# Patient Record
Sex: Male | Born: 1960 | Race: White | Hispanic: No | Smoking: Never smoker
Health system: Southern US, Community
[De-identification: ages and names within clinical notes are randomized; demographics above are authoritative.]

## PROBLEM LIST (undated history)

## (undated) DIAGNOSIS — G4733 Obstructive sleep apnea (adult) (pediatric): Secondary | ICD-10-CM

## (undated) DIAGNOSIS — N200 Calculus of kidney: Secondary | ICD-10-CM

## (undated) DIAGNOSIS — E7800 Pure hypercholesterolemia, unspecified: Secondary | ICD-10-CM

## (undated) DIAGNOSIS — Z9989 Dependence on other enabling machines and devices: Secondary | ICD-10-CM

## (undated) DIAGNOSIS — I10 Essential (primary) hypertension: Secondary | ICD-10-CM

## (undated) HISTORY — PX: TONSILLECTOMY AND ADENOIDECTOMY: SUR1326

## (undated) HISTORY — PX: HERNIA REPAIR: SHX51

---

## 1998-06-20 ENCOUNTER — Emergency Department (HOSPITAL_COMMUNITY): Admission: EM | Admit: 1998-06-20 | Discharge: 1998-06-20 | Payer: Self-pay

## 1999-04-04 ENCOUNTER — Encounter: Payer: Self-pay | Admitting: Occupational Medicine

## 1999-04-04 ENCOUNTER — Ambulatory Visit: Admission: RE | Admit: 1999-04-04 | Discharge: 1999-04-04 | Payer: Self-pay | Admitting: Occupational Medicine

## 2002-01-27 ENCOUNTER — Emergency Department (HOSPITAL_COMMUNITY): Admission: EM | Admit: 2002-01-27 | Discharge: 2002-01-27 | Payer: Self-pay | Admitting: Emergency Medicine

## 2005-05-01 ENCOUNTER — Ambulatory Visit (HOSPITAL_COMMUNITY): Admission: RE | Admit: 2005-05-01 | Discharge: 2005-05-01 | Payer: Self-pay | Admitting: Internal Medicine

## 2008-03-01 ENCOUNTER — Emergency Department (HOSPITAL_COMMUNITY): Admission: EM | Admit: 2008-03-01 | Discharge: 2008-03-01 | Payer: Self-pay | Admitting: Emergency Medicine

## 2008-06-17 ENCOUNTER — Emergency Department (HOSPITAL_COMMUNITY): Admission: EM | Admit: 2008-06-17 | Discharge: 2008-06-17 | Payer: Self-pay | Admitting: Emergency Medicine

## 2008-06-20 ENCOUNTER — Encounter (INDEPENDENT_AMBULATORY_CARE_PROVIDER_SITE_OTHER): Payer: Self-pay | Admitting: Internal Medicine

## 2008-06-20 ENCOUNTER — Ambulatory Visit: Payer: Self-pay | Admitting: Vascular Surgery

## 2008-06-20 ENCOUNTER — Inpatient Hospital Stay (HOSPITAL_COMMUNITY): Admission: EM | Admit: 2008-06-20 | Discharge: 2008-06-25 | Payer: Self-pay | Admitting: Emergency Medicine

## 2009-06-11 ENCOUNTER — Observation Stay (HOSPITAL_COMMUNITY): Admission: EM | Admit: 2009-06-11 | Discharge: 2009-06-12 | Payer: Self-pay | Admitting: Emergency Medicine

## 2009-09-08 ENCOUNTER — Emergency Department (HOSPITAL_COMMUNITY): Admission: EM | Admit: 2009-09-08 | Discharge: 2009-09-08 | Payer: Self-pay | Admitting: Family Medicine

## 2009-09-21 ENCOUNTER — Emergency Department (HOSPITAL_COMMUNITY): Admission: EM | Admit: 2009-09-21 | Discharge: 2009-09-21 | Payer: Self-pay | Admitting: Emergency Medicine

## 2009-09-27 ENCOUNTER — Emergency Department (HOSPITAL_COMMUNITY): Admission: EM | Admit: 2009-09-27 | Discharge: 2009-09-27 | Payer: Self-pay | Admitting: Emergency Medicine

## 2009-11-03 ENCOUNTER — Emergency Department (HOSPITAL_COMMUNITY): Admission: EM | Admit: 2009-11-03 | Discharge: 2009-11-03 | Payer: Self-pay | Admitting: Family Medicine

## 2009-11-28 ENCOUNTER — Emergency Department (HOSPITAL_COMMUNITY): Admission: EM | Admit: 2009-11-28 | Discharge: 2009-11-28 | Payer: Self-pay | Admitting: Family Medicine

## 2010-01-06 ENCOUNTER — Emergency Department (HOSPITAL_COMMUNITY): Admission: EM | Admit: 2010-01-06 | Discharge: 2010-01-06 | Payer: Self-pay | Admitting: Family Medicine

## 2010-01-11 HISTORY — PX: CARDIAC CATHETERIZATION: SHX172

## 2010-01-11 HISTORY — PX: CORONARY ANGIOGRAM: SHX5786

## 2010-02-03 ENCOUNTER — Ambulatory Visit: Payer: Self-pay | Admitting: Internal Medicine

## 2010-02-03 ENCOUNTER — Observation Stay (HOSPITAL_COMMUNITY): Admission: EM | Admit: 2010-02-03 | Discharge: 2010-02-07 | Payer: Self-pay | Admitting: Emergency Medicine

## 2010-02-04 ENCOUNTER — Encounter (INDEPENDENT_AMBULATORY_CARE_PROVIDER_SITE_OTHER): Payer: Self-pay | Admitting: Internal Medicine

## 2010-07-10 ENCOUNTER — Emergency Department (HOSPITAL_COMMUNITY): Admission: EM | Admit: 2010-07-10 | Discharge: 2010-07-10 | Payer: Self-pay | Admitting: Family Medicine

## 2010-07-24 ENCOUNTER — Emergency Department (HOSPITAL_COMMUNITY)
Admission: EM | Admit: 2010-07-24 | Discharge: 2010-07-24 | Payer: Self-pay | Source: Home / Self Care | Admitting: Emergency Medicine

## 2010-10-20 ENCOUNTER — Inpatient Hospital Stay (INDEPENDENT_AMBULATORY_CARE_PROVIDER_SITE_OTHER)
Admission: RE | Admit: 2010-10-20 | Discharge: 2010-10-20 | Disposition: A | Discharge status: Home / Self Care | Payer: BC Managed Care – PPO | Source: Ambulatory Visit | Attending: Family Medicine | Admitting: Family Medicine

## 2010-10-20 DIAGNOSIS — R197 Diarrhea, unspecified: Secondary | ICD-10-CM

## 2010-10-20 DIAGNOSIS — R112 Nausea with vomiting, unspecified: Secondary | ICD-10-CM

## 2010-10-29 LAB — DIFFERENTIAL
Eosinophils Relative: 2 % (ref 0–5)
Lymphocytes Relative: 12 % (ref 12–46)
Lymphs Abs: 1.1 10*3/uL (ref 0.7–4.0)
Monocytes Relative: 6 % (ref 3–12)
Neutrophils Relative %: 80 % — ABNORMAL HIGH (ref 43–77)

## 2010-10-29 LAB — TROPONIN I: Troponin I: <0.01 ng/mL (ref 0.00–0.06)

## 2010-10-29 LAB — LIPID PANEL
Total CHOL/HDL Ratio: 4.4 RATIO
Triglycerides: 154 mg/dL — ABNORMAL HIGH (ref <150)
VLDL: 31 mg/dL (ref 0–40)

## 2010-10-29 LAB — BASIC METABOLIC PANEL
BUN: 12 mg/dL (ref 6–23)
BUN: 15 mg/dL (ref 6–23)
BUN: 6 mg/dL (ref 6–23)
CO2: 25 mEq/L (ref 19–32)
CO2: 25 mEq/L (ref 19–32)
Calcium: 8.1 mg/dL — ABNORMAL LOW (ref 8.4–10.5)
Calcium: 8.8 mg/dL (ref 8.4–10.5)
Creatinine, Ser: 0.75 mg/dL (ref 0.4–1.5)
Creatinine, Ser: 0.8 mg/dL (ref 0.4–1.5)
Creatinine, Ser: 0.97 mg/dL (ref 0.4–1.5)
GFR calc Af Amer: >60 mL/min (ref >60)
GFR calc Af Amer: >60 mL/min (ref >60)
GFR calc Af Amer: >60 mL/min (ref >60)
GFR calc non Af Amer: >60 mL/min (ref >60)
GFR calc non Af Amer: >60 mL/min (ref >60)
GFR calc non Af Amer: >60 mL/min (ref >60)
Glucose, Bld: 100 mg/dL — ABNORMAL HIGH (ref 70–99)
Glucose, Bld: 102 mg/dL — ABNORMAL HIGH (ref 70–99)
Glucose, Bld: 107 mg/dL — ABNORMAL HIGH (ref 70–99)
Potassium: 3.6 mEq/L (ref 3.5–5.1)
Potassium: 3.6 mEq/L (ref 3.5–5.1)
Sodium: 135 mEq/L (ref 135–145)
Sodium: 136 mEq/L (ref 135–145)
Sodium: 137 mEq/L (ref 135–145)
Sodium: 139 mEq/L (ref 135–145)

## 2010-10-29 LAB — CBC
HCT: 37.1 % — ABNORMAL LOW (ref 39.0–52.0)
HCT: 37.9 % — ABNORMAL LOW (ref 39.0–52.0)
HCT: 38.5 % — ABNORMAL LOW (ref 39.0–52.0)
Hemoglobin: 12.7 g/dL — ABNORMAL LOW (ref 13.0–17.0)
Hemoglobin: 12.9 g/dL — ABNORMAL LOW (ref 13.0–17.0)
Hemoglobin: 13.4 g/dL (ref 13.0–17.0)
MCHC: 34.1 g/dL (ref 30.0–36.0)
MCHC: 34.7 g/dL (ref 30.0–36.0)
MCV: 85.2 fL (ref 78.0–100.0)
Platelets: 144 10*3/uL — ABNORMAL LOW (ref 150–400)
Platelets: 245 10*3/uL (ref 150–400)
RBC: 4.43 MIL/uL (ref 4.22–5.81)
RBC: 4.43 MIL/uL (ref 4.22–5.81)
RBC: 4.53 MIL/uL (ref 4.22–5.81)
RDW: 15.9 % — ABNORMAL HIGH (ref 11.5–15.5)
RDW: 16.3 % — ABNORMAL HIGH (ref 11.5–15.5)
RDW: 16.3 % — ABNORMAL HIGH (ref 11.5–15.5)
WBC: 6.5 10*3/uL (ref 4.0–10.5)

## 2010-10-29 LAB — CULTURE, BLOOD (ROUTINE X 2)
Culture: NO GROWTH
Culture: NO GROWTH

## 2010-10-29 LAB — CARDIAC PANEL(CRET KIN+CKTOT+MB+TROPI)
CK, MB: 5.4 ng/mL — ABNORMAL HIGH (ref 0.3–4.0)
Relative Index: 0.8 (ref 0.0–2.5)
Relative Index: 1 (ref 0.0–2.5)
Total CK: 534 U/L — ABNORMAL HIGH (ref 7–232)
Troponin I: 0.01 ng/mL (ref 0.00–0.06)
Troponin I: 0.02 ng/mL (ref 0.00–0.06)

## 2010-10-29 LAB — CK TOTAL AND CKMB (NOT AT ARMC)
Relative Index: 1 (ref 0.0–2.5)
Total CK: 680 U/L — ABNORMAL HIGH (ref 7–232)

## 2010-11-16 LAB — URINALYSIS, ROUTINE W REFLEX MICROSCOPIC
Bilirubin Urine: NEGATIVE
Hgb urine dipstick: NEGATIVE
Nitrite: NEGATIVE
Nitrite: NEGATIVE
Protein, ur: NEGATIVE mg/dL
Specific Gravity, Urine: 1.013 (ref 1.005–1.030)
Specific Gravity, Urine: 1.017 (ref 1.005–1.030)
Urobilinogen, UA: 0.2 mg/dL (ref 0.0–1.0)
Urobilinogen, UA: 0.2 mg/dL (ref 0.0–1.0)
pH: 7 (ref 5.0–8.0)

## 2010-11-16 LAB — COMPREHENSIVE METABOLIC PANEL
ALT: 33 U/L (ref 0–53)
AST: 23 U/L (ref 0–37)
AST: 25 U/L (ref 0–37)
Albumin: 3.2 g/dL — ABNORMAL LOW (ref 3.5–5.2)
CO2: 26 mEq/L (ref 19–32)
Calcium: 8.1 mg/dL — ABNORMAL LOW (ref 8.4–10.5)
Creatinine, Ser: 0.89 mg/dL (ref 0.4–1.5)
GFR calc Af Amer: >60 mL/min (ref >60)
GFR calc Af Amer: >60 mL/min (ref >60)
GFR calc non Af Amer: >60 mL/min (ref >60)
GFR calc non Af Amer: >60 mL/min (ref >60)
Glucose, Bld: 103 mg/dL — ABNORMAL HIGH (ref 70–99)
Sodium: 134 mEq/L — ABNORMAL LOW (ref 135–145)
Total Protein: 6.4 g/dL (ref 6.0–8.3)
Total Protein: 6.7 g/dL (ref 6.0–8.3)

## 2010-11-16 LAB — T4, FREE: Free T4: 0.79 ng/dL — ABNORMAL LOW (ref 0.80–1.80)

## 2010-11-16 LAB — CBC
Hemoglobin: 13.5 g/dL (ref 13.0–17.0)
MCHC: 33.3 g/dL (ref 30.0–36.0)
MCHC: 33.5 g/dL (ref 30.0–36.0)
MCV: 86.1 fL (ref 78.0–100.0)
RBC: 4.69 MIL/uL (ref 4.22–5.81)
RBC: 4.82 MIL/uL (ref 4.22–5.81)
RDW: 16.8 % — ABNORMAL HIGH (ref 11.5–15.5)

## 2010-11-16 LAB — CULTURE, BLOOD (ROUTINE X 2)
Culture: NO GROWTH
Culture: NO GROWTH

## 2010-11-16 LAB — WOUND CULTURE: Gram Stain: NONE SEEN

## 2010-11-16 LAB — LIPID PANEL
Cholesterol: 141 mg/dL (ref 0–200)
LDL Cholesterol: 87 mg/dL (ref 0–99)
Triglycerides: 140 mg/dL (ref <150)

## 2010-11-16 LAB — DIFFERENTIAL
Basophils Absolute: 0 10*3/uL (ref 0.0–0.1)
Eosinophils Absolute: 0.5 10*3/uL (ref 0.0–0.7)
Lymphocytes Relative: 20 % (ref 12–46)
Monocytes Relative: 4 % (ref 3–12)
Neutrophils Relative %: 70 % (ref 43–77)

## 2010-11-16 LAB — HEMOGLOBIN A1C
Hgb A1c MFr Bld: 5.9 % (ref 4.6–6.1)
Mean Plasma Glucose: 123 mg/dL

## 2010-11-16 LAB — LACTIC ACID, PLASMA: Lactic Acid, Venous: 1.5 mmol/L (ref 0.5–2.2)

## 2010-12-26 NOTE — Discharge Summary (Signed)
NAME:  Anthony Fitzpatrick, Anthony Fitzpatrick                ACCOUNT NO.:  [id number]   MEDICAL RECORD NO.:  [id number]          PATIENT TYPE:  INP   LOCATION:  1521                         FACILITY:  [facility]   PHYSICIAN:  Hind I Elsaid, MD      DATE OF BIRTH:  Jul 22, 1961   DATE OF ADMISSION:  06/19/2008  DATE OF DISCHARGE:  06/25/2008                               DISCHARGE SUMMARY   PRIMARY CARE PHYSICIAN:  Cala Bradford R. Renae Gloss, M.D.   DISCHARGE DIAGNOSES:  1. Left lower leg cellulitis.  2. Herpes zoster shingle infection of the left thigh.  3. Chronic venous insufficiency with venous stasis of both lower      extremities.  4. Morbid obesity.  5. Hypertension.   DISCHARGE MEDICATIONS:  1. Clindamycin 300 mg p.o. q.6h. for two weeks.  2. Valtrex 1,000 mg p.o. t.i.d. for two days.  3. Cardizem CD 240 mg daily.  4. Crestor 10 mg daily. .  5. Lisinopril 20 mg daily.  6. Lasix 40 mg daily.  7. Potassium chloride 10 mEq daily.   CONSULTATION:  None.   PROCEDURES:  1. CT angiogram negative for pulmonary embolism.  2. Venous Doppler of the lower extremity:  No evidence of deep venous      thrombosis, superficial thrombosis or Baker's cyst.   HISTORY AND HOSPITAL COURSE:  This 50 year old male admitted with  redness and swelling of his lower aspect of his left leg along with  painful burning rash along the side of his left thigh.  The patient was  diagnosed left leg cellulitis, started on broad-spectrum antibiotic.  Also, there is evidence of some bullae on his left thigh diagnosed as  herpes zoster and shingles and for that the patient was started on  Valtrex therapy for 7 days until crustation happening.  The patient was  evaluated and CT angio and venous Doppler were negative. During hospital  stay the patient has no fever, no white blood cells and the redness and  swelling gradually coming down.  The patient will be discharged with  p.o. antibiotic, mainly Clindamycin and Valtrex.  The patient also  asked  to use TED hose for his left  lower extremity swelling.  At this time we felt the patient was  medically stable to be discharged home and follow with Dr. Andi Devon within one week.  Also the patient has a history of sleep apnea  and needs to continue with his CPAP.      Hind Bosie Helper, MD  Electronically Signed     HIE/MEDQ  D:  06/25/2008  T:  06/25/2008  Job:  [id number]   cc:   Merlene Laughter. Renae Gloss, M.D.  Fax: [phone]

## 2010-12-26 NOTE — H&P (Signed)
NAME:  Anthony Fitzpatrick, Anthony Fitzpatrick                ACCOUNT NO.:  [id number]   MEDICAL RECORD NO.:  [id number]          PATIENT TYPE:  INP   LOCATION:  1521                         FACILITY:  [facility]   PHYSICIAN:  Della Goo, M.D. DATE OF BIRTH:  1961/07/09   DATE OF ADMISSION:  06/19/2008  DATE OF DISCHARGE:                              HISTORY & PHYSICAL   PRIMARY CARE PHYSICIAN:  Dr. Andi Devon.   CHIEF COMPLAINTS:  Redness, swelling, left lower leg.   HISTORY OF PRESENT ILLNESS:  This is a 50 year old male who presents to  the emergency department with complaints of worsening redness and  swelling in the lower aspect of the left leg along with a painful  burning rash which has occurred on the side of the left thigh.  Patient  reports beginning to have the redness in the lower leg 1 day ago which  has worsened and has been very painful to palpation.  He reports the  redness has worsened.  He reports that his legs usually are dark in both  of the lower legs and the redness has been a significant change.  Patient denies having any fevers, chills, chest pain, shortness of  breath, nausea, vomiting, or diarrhea.   PAST MEDICAL HISTORY:  Significant for:  1. Hypertension.  2. Hyperlipidemia.  3. Morbid obesity.  4. Depression.   MEDICATIONS:  Include:  1. Cardizem CD 240 mg 1 p.o. daily.  2. Crestor 10 mg 1 p.o. daily.  3. Lexapro 10 mg 1 p.o. daily.  4. Lisinopril 20 mg 1 p.o. daily.  5. Lasix 40 mg 1 p.o. daily.  6. Potassium chloride 10 mEq p.o. daily.   ALLERGIES:  NO KNOWN DRUG ALLERGIES.   SOCIAL HISTORY:  The patient is married.  He is a nonsmoker, nondrinker.   FAMILY HISTORY:  Noncontributory.   REVIEW OF SYSTEMS:  Pertinents are mentioned above.   PHYSICAL EXAMINATION FINDINGS:  This is a 50 year old morbidly obese  male in discomfort but no acute distress.  VITAL SIGNS:  Temperature 97.2.  Blood pressure 130/74.  Heart rate 83.  Respirations 20.  O2 sats 94% to  100%.  HEENT EXAMINATION:  Normocephalic, atraumatic.  Pupils equally round,  reactive to light.  Extraocular movements are intact.  Funduscopic  benign.  Oropharynx is clear.  NECK:  Supple.  Full range of motion.  No thyromegaly, adenopathy, or  jugular venous distention.  CARDIOVASCULAR:  Regular rate and rhythm.  No murmurs, gallops, or rubs.  LUNGS:  Clear to auscultation bilaterally.  ABDOMEN:  Positive bowel sounds.  Soft, nontender, nondistended.  EXTREMITIES:  Reveal trace edema of both lower legs to the pretibial  areas.  Also, patient has confluent erythema of the left lower leg which  is circumferential around the lower leg to the pretibial area.  There  are no visible ulcerations or punctate seen.  Also, there is a linear  vesicular erythematous rash on the lateral aspect of the left thigh.  NEUROLOGIC EXAMINATION:  Nonfocal.   LABORATORY STUDIES:  White blood cell count 7.5, hemoglobin 12.9,  hematocrit 38.1, platelets 204, neutrophils 77%  lymphocytes 14%.  Sodium  135, potassium 3.9, chloride 99, bicarb 26, BUN 10, creatinine 1.2, and  glucose 113.  D-dimer 1.22.  Point of care cardiac markers with a  myoglobin of 192, CK-MB 5.0, troponin less than 0.05.  Chest x-ray  reveals no acute disease process.  Mild cardiomegaly is present.   ASSESSMENT:  A 50 year old male being admitted with:  1. Cellulitis of the left lower extremity.  2. Herpes zoster/shingles infection of the left thigh.  3. Positive D-dimer.  4. Chronic venous insufficiency with venous stasis disease of both      lower extremities.  5. Hypertension.  6. Morbid obesity.   PLAN:  Patient will be admitted and will be placed on IV antibiotic  therapy of vancomycin and Ancef to cover the cellulitis and patient has  been started on Valtrex therapy for 7 days for treatment of herpes  zoster/shingles.  Patient will also be placed on IV fluids and pain  medication therapy as needed.  A CT angiogram study of the  chest will be  ordered and an ultrasound study of the left lower leg.  Full-dose  Lovenox therapy has also been ordered and patient will continue on his  regular medications.  GI prophylaxis has also been ordered.      Della Goo, M.D.  Electronically Signed     HJ/MEDQ  D:  06/20/2008  T:  06/20/2008  Job:  [id number]   cc:   Merlene Laughter. Renae Gloss, M.D.  Fax: [phone]

## 2011-01-28 IMAGING — CR DG CHEST 2V
2 series · 2 of 2 positions shown · non-contrast
Comparison: 06/19/2008.

CLINICAL DATA: Chest pain. Hypertension.

CHEST - 2 VIEW

[w chest pa *]
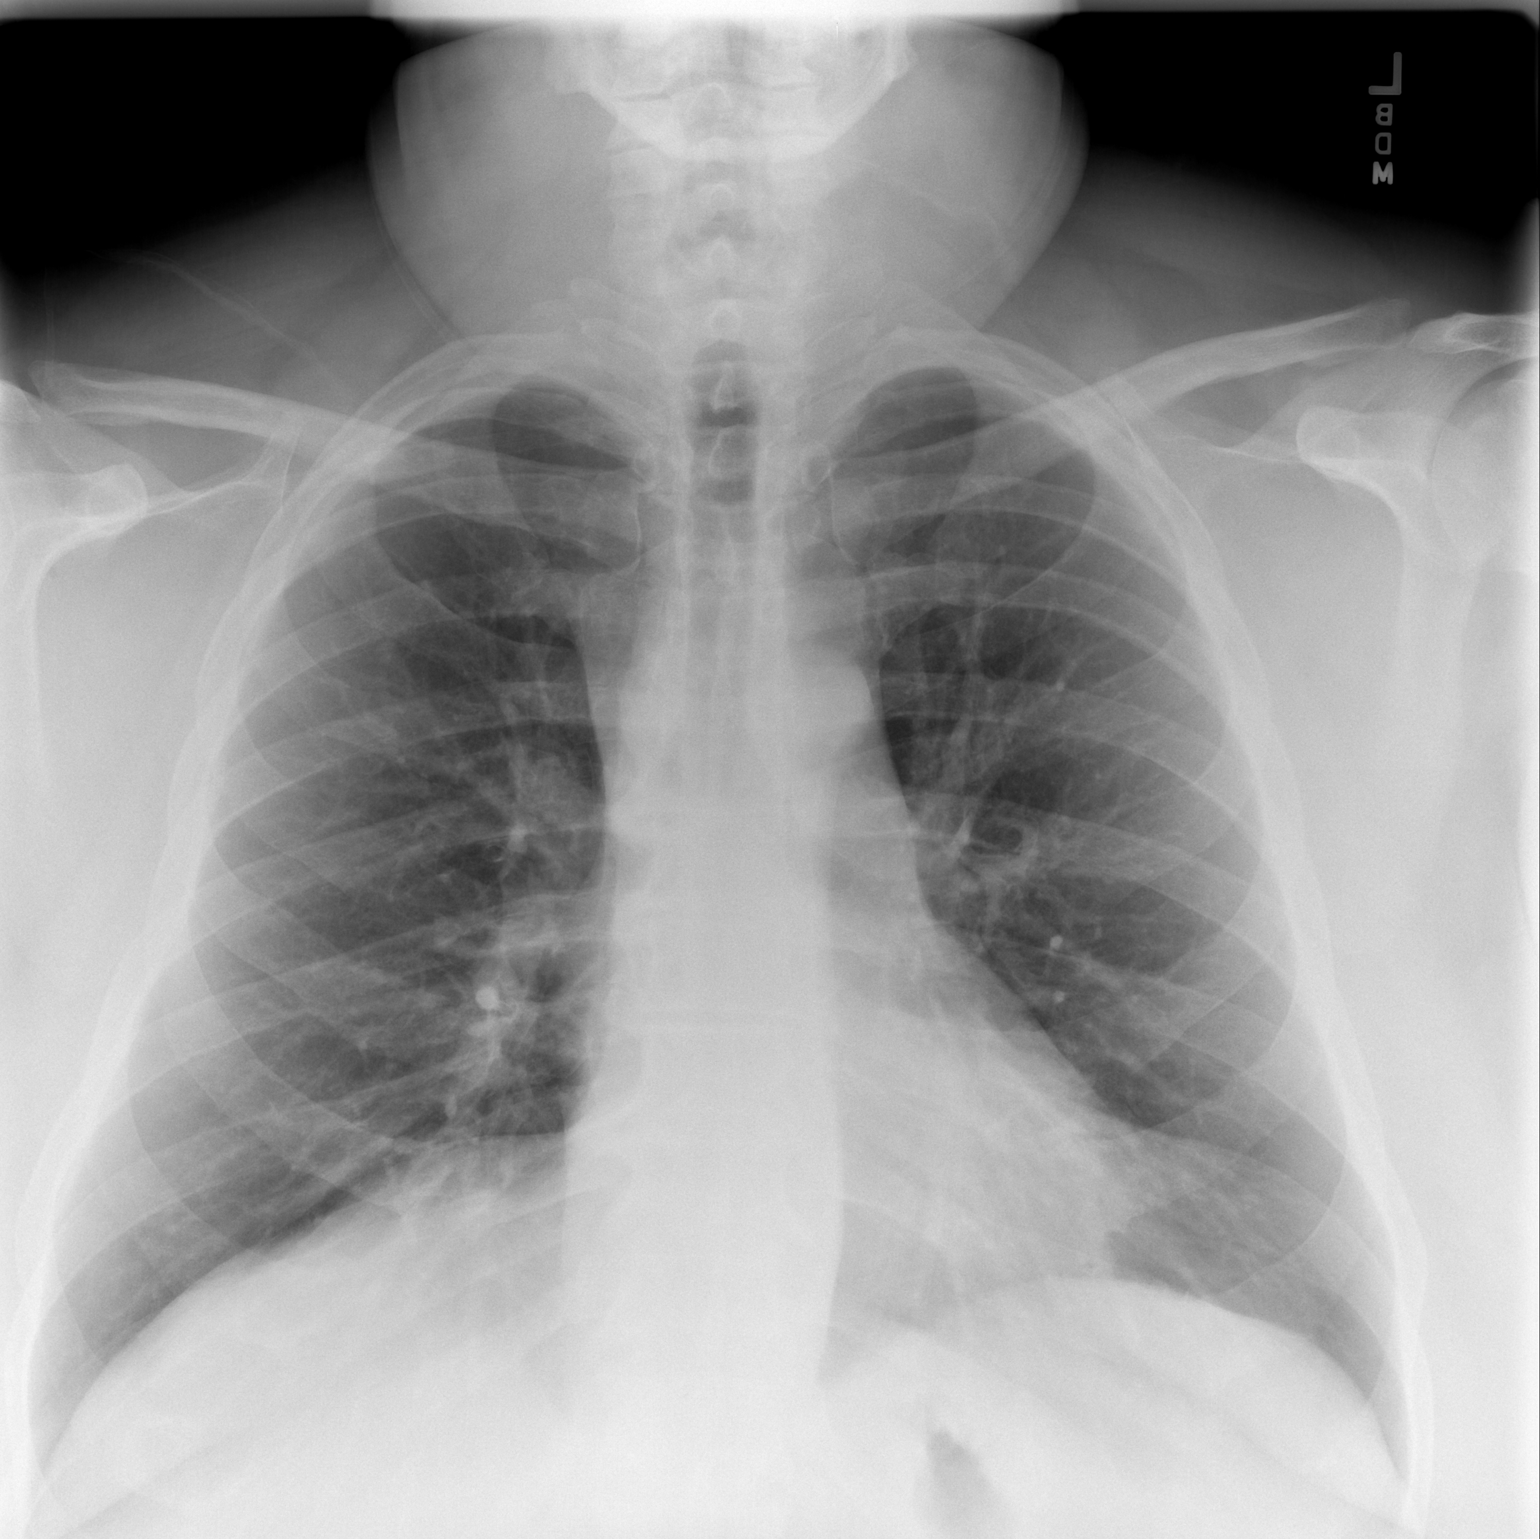

[w chest lat *]
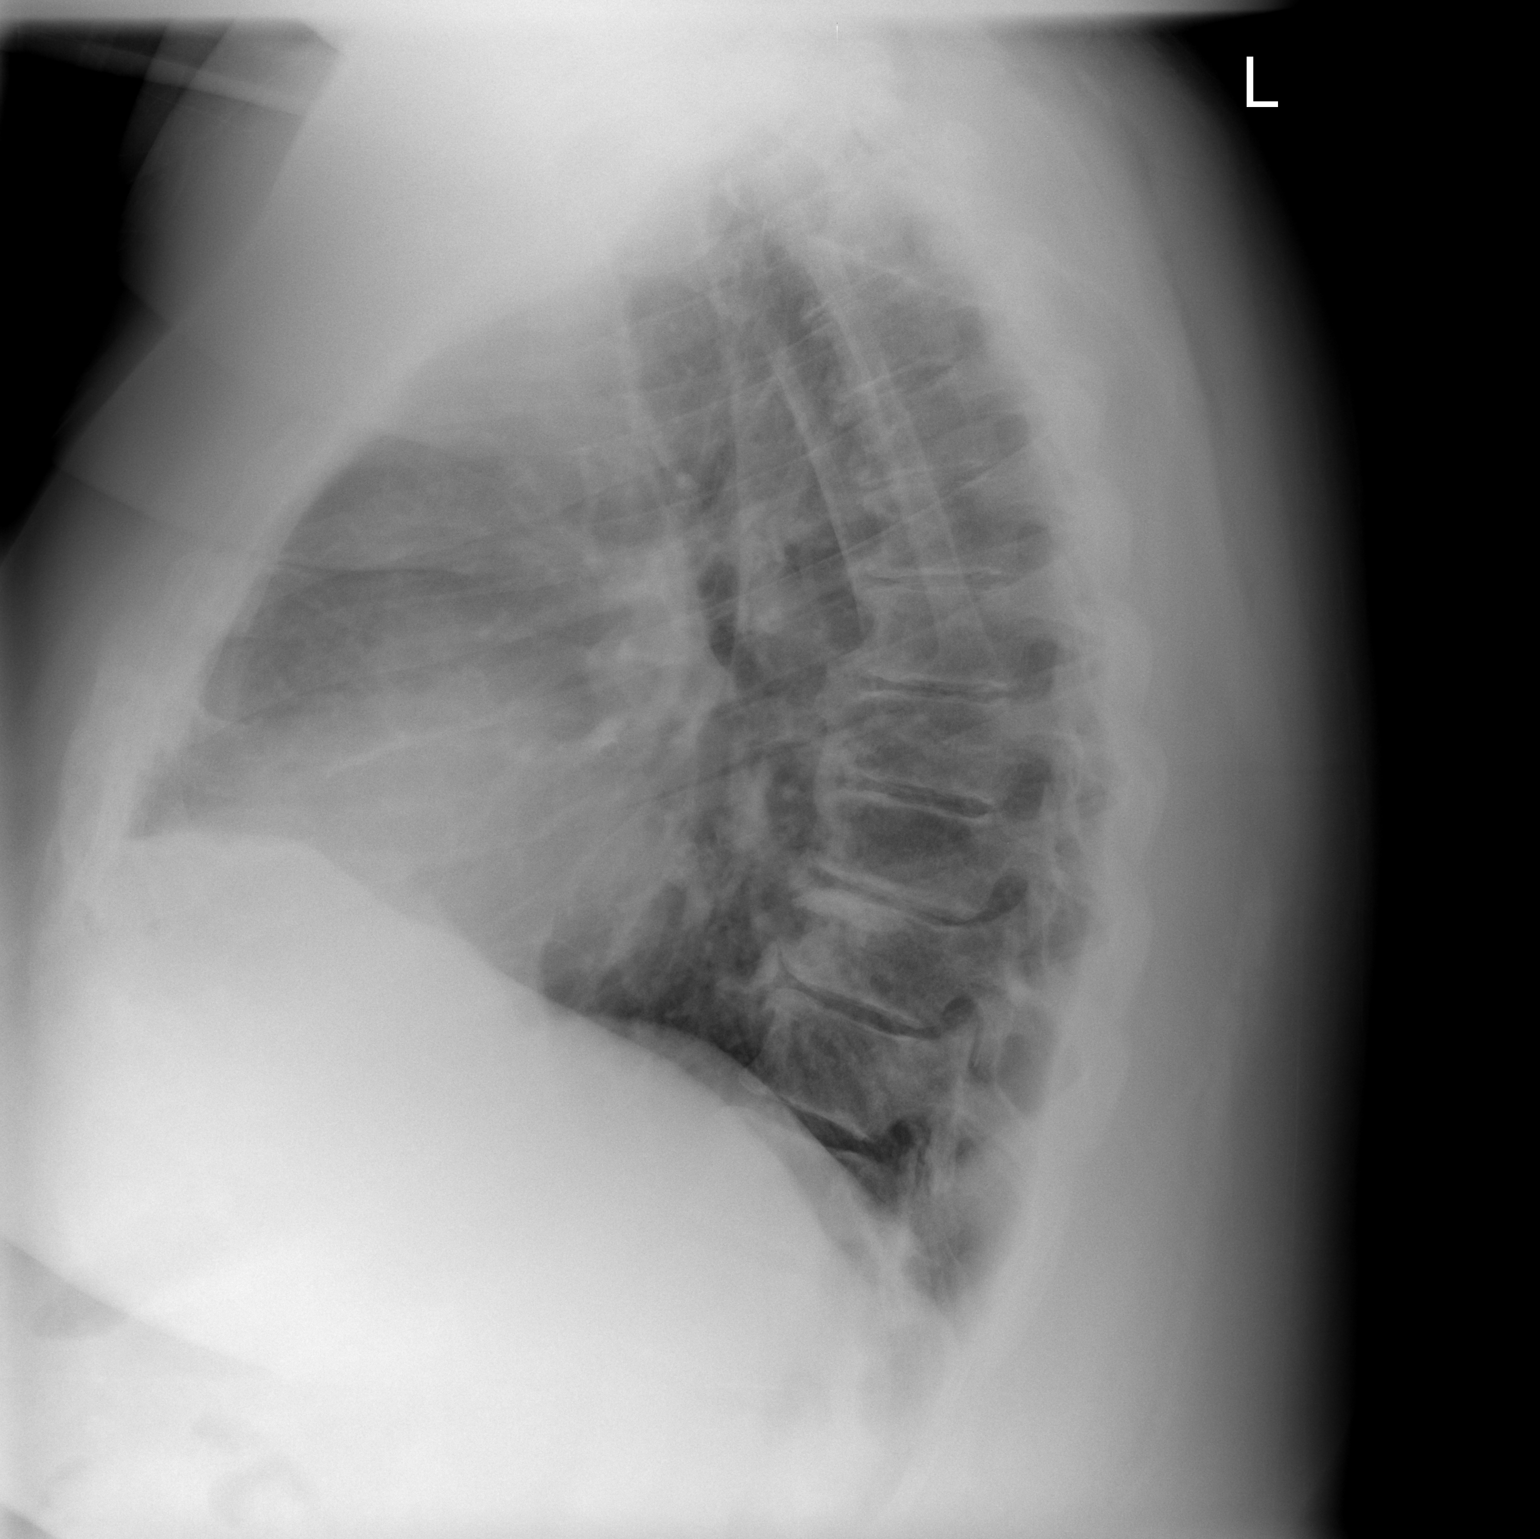

[2 of 2 positions shown; findings below may reference images not displayed]

FINDINGS: Normal cardiomediastinal silhouette.  No pulmonary
vascular congestion or active lung process.  Mild degenerative
changes of the thoracic spine.
IMPRESSION: No active chest disease.

## 2011-02-22 ENCOUNTER — Inpatient Hospital Stay (INDEPENDENT_AMBULATORY_CARE_PROVIDER_SITE_OTHER)
Admission: RE | Admit: 2011-02-22 | Discharge: 2011-02-22 | Disposition: A | Discharge status: Home / Self Care | Payer: BC Managed Care – PPO | Source: Ambulatory Visit | Attending: Emergency Medicine | Admitting: Emergency Medicine

## 2011-02-22 DIAGNOSIS — S335XXA Sprain of ligaments of lumbar spine, initial encounter: Secondary | ICD-10-CM

## 2011-04-20 ENCOUNTER — Emergency Department (HOSPITAL_COMMUNITY): Payer: BC Managed Care – PPO

## 2011-04-20 ENCOUNTER — Emergency Department (HOSPITAL_COMMUNITY)
Admission: EM | Admit: 2011-04-20 | Discharge: 2011-04-20 | Disposition: A | Discharge status: Home / Self Care | Payer: BC Managed Care – PPO | Attending: Emergency Medicine | Admitting: Emergency Medicine

## 2011-04-20 ENCOUNTER — Inpatient Hospital Stay (INDEPENDENT_AMBULATORY_CARE_PROVIDER_SITE_OTHER)
Admission: RE | Admit: 2011-04-20 | Discharge: 2011-04-20 | Disposition: A | Discharge status: Home / Self Care | Payer: BC Managed Care – PPO | Source: Ambulatory Visit | Attending: Family Medicine | Admitting: Family Medicine

## 2011-04-20 DIAGNOSIS — R072 Precordial pain: Secondary | ICD-10-CM | POA: Insufficient documentation

## 2011-04-20 DIAGNOSIS — I10 Essential (primary) hypertension: Secondary | ICD-10-CM | POA: Insufficient documentation

## 2011-04-20 DIAGNOSIS — E789 Disorder of lipoprotein metabolism, unspecified: Secondary | ICD-10-CM | POA: Insufficient documentation

## 2011-04-20 DIAGNOSIS — R079 Chest pain, unspecified: Secondary | ICD-10-CM

## 2011-04-20 LAB — CBC
HCT: 37 % — ABNORMAL LOW (ref 39.0–52.0)
MCH: 28.5 pg (ref 26.0–34.0)
MCHC: 34.6 g/dL (ref 30.0–36.0)
MCV: 82.4 fL (ref 78.0–100.0)
RDW: 15.4 % (ref 11.5–15.5)

## 2011-04-20 LAB — POCT I-STAT, CHEM 8
BUN: 16 mg/dL (ref 6–23)
Calcium, Ion: 1.16 mmol/L (ref 1.12–1.32)
Chloride: 101 mEq/L (ref 96–112)
Glucose, Bld: 109 mg/dL — ABNORMAL HIGH (ref 70–99)
TCO2: 27 mmol/L (ref 0–100)

## 2011-05-10 ENCOUNTER — Inpatient Hospital Stay (INDEPENDENT_AMBULATORY_CARE_PROVIDER_SITE_OTHER)
Admission: RE | Admit: 2011-05-10 | Discharge: 2011-05-10 | Disposition: A | Discharge status: Home / Self Care | Payer: BC Managed Care – PPO | Source: Ambulatory Visit | Attending: Family Medicine | Admitting: Family Medicine

## 2011-05-10 DIAGNOSIS — B9789 Other viral agents as the cause of diseases classified elsewhere: Secondary | ICD-10-CM

## 2011-05-15 LAB — CBC
HCT: 33.1 — ABNORMAL LOW
HCT: 36.4 — ABNORMAL LOW
HCT: 36.5 — ABNORMAL LOW
HCT: 38.1 — ABNORMAL LOW
Hemoglobin: 11.4 — ABNORMAL LOW
Hemoglobin: 12.1 — ABNORMAL LOW
Hemoglobin: 12.9 — ABNORMAL LOW
MCHC: 33.4
MCHC: 33.9
MCV: 84.9
MCV: 85.6
MCV: 86
MCV: 86.6
Platelets: 196
Platelets: 283
RBC: 3.97 — ABNORMAL LOW
RBC: 4.2 — ABNORMAL LOW
RDW: 16.5 — ABNORMAL HIGH
RDW: 16.6 — ABNORMAL HIGH
WBC: 7.3
WBC: 8.3

## 2011-05-15 LAB — DIFFERENTIAL
Basophils Absolute: 0
Basophils Absolute: 0
Eosinophils Absolute: 0.1
Eosinophils Relative: 1
Lymphocytes Relative: 14
Lymphocytes Relative: 17
Monocytes Absolute: 0.6
Monocytes Absolute: 0.8
Neutro Abs: 5.9

## 2011-05-15 LAB — POCT I-STAT, CHEM 8
Calcium, Ion: 1.08 — ABNORMAL LOW
Chloride: 99
Glucose, Bld: 113 — ABNORMAL HIGH
HCT: 38 — ABNORMAL LOW

## 2011-05-15 LAB — URINE MICROSCOPIC-ADD ON

## 2011-05-15 LAB — COMPREHENSIVE METABOLIC PANEL
Albumin: 2.5 — ABNORMAL LOW
Albumin: 2.7 — ABNORMAL LOW
BUN: 10
BUN: 6
Chloride: 101
Chloride: 103
Creatinine, Ser: 0.81
Creatinine, Ser: 0.99
GFR calc non Af Amer: >60
Glucose, Bld: 107 — ABNORMAL HIGH
Total Bilirubin: 0.8
Total Bilirubin: 1.2
Total Protein: 6.4

## 2011-05-15 LAB — POCT CARDIAC MARKERS
CKMB, poc: 5
Myoglobin, poc: 192
Troponin i, poc: <0.05

## 2011-05-15 LAB — URINALYSIS, ROUTINE W REFLEX MICROSCOPIC
Hgb urine dipstick: NEGATIVE
Protein, ur: 100 — AB
Urobilinogen, UA: 4 — ABNORMAL HIGH

## 2011-05-15 LAB — TSH: TSH: 3.185

## 2011-07-11 ENCOUNTER — Encounter: Payer: Self-pay | Admitting: Emergency Medicine

## 2011-07-11 ENCOUNTER — Emergency Department (INDEPENDENT_AMBULATORY_CARE_PROVIDER_SITE_OTHER)
Admission: EM | Admit: 2011-07-11 | Discharge: 2011-07-11 | Disposition: A | Discharge status: Home / Self Care | Payer: BC Managed Care – PPO | Source: Home / Self Care | Attending: Family Medicine | Admitting: Family Medicine

## 2011-07-11 ENCOUNTER — Other Ambulatory Visit: Payer: Self-pay

## 2011-07-11 DIAGNOSIS — H8110 Benign paroxysmal vertigo, unspecified ear: Secondary | ICD-10-CM

## 2011-07-11 HISTORY — DX: Essential (primary) hypertension: I10

## 2011-07-11 LAB — POCT I-STAT, CHEM 8
Chloride: 105 mEq/L (ref 96–112)
Glucose, Bld: 104 mg/dL — ABNORMAL HIGH (ref 70–99)
HCT: 40 % (ref 39.0–52.0)
Potassium: 4.1 mEq/L (ref 3.5–5.1)
Sodium: 135 mEq/L (ref 135–145)

## 2011-07-11 MED ORDER — MECLIZINE HCL 50 MG PO TABS: 50.0000 mg | ORAL_TABLET | Freq: Three times a day (TID) | ORAL | Status: AC | PRN

## 2011-07-11 MED ORDER — FEXOFENADINE-PSEUDOEPHED ER 60-120 MG PO TB12: 1.0000 | ORAL_TABLET | Freq: Two times a day (BID) | ORAL | Status: DC

## 2011-07-11 MED ORDER — ONDANSETRON HCL 4 MG PO TABS: 4.0000 mg | ORAL_TABLET | Freq: Three times a day (TID) | ORAL | Status: AC | PRN

## 2011-07-11 NOTE — ED Provider Notes (Signed)
History     CSN: [id number] Arrival date & time: 07/11/2011 10:56 AM   First MD Initiated Contact with Patient 07/11/11 1058      Chief Complaint  Patient presents with  . Dizziness  . Nausea    (Consider location/radiation/quality/duration/timing/severity/associated sxs/prior treatment) HPI Comments: Morbidly obese h/o sinus bradycardia. Here c/o one episode of dizziness last night lasting few minutes and self resolved. Felt room spinning around him and nausea but no vomiting. No syncope palpitations or chest pain.  Today mild bitemporal headache and has had mild nasal congestion. No fever or cough.  Took Diovan for blood pressure this morning. No shortness of breath, has chronic low extremity edema and venostasis skin changes. Had very brief similar episode of dizziness when getting up from chair at waiting area, self resolved now. No symptoms currently.   The history is provided by the patient.    Past Medical History  Diagnosis Date  . Hypertension     History reviewed. No pertinent past surgical history.  History reviewed. No pertinent family history.  History  Substance Use Topics  . Smoking status: Never Smoker   . Smokeless tobacco: Not on file  . Alcohol Use: Yes      Review of Systems  Constitutional: Negative.   HENT: Positive for congestion. Negative for ear pain, sore throat and sinus pressure.   Cardiovascular: Negative for chest pain and palpitations.  Gastrointestinal: Positive for nausea. Negative for diarrhea.  Genitourinary: Negative for dysuria and frequency.  Skin: Negative for rash.  Neurological: Positive for dizziness and headaches. Negative for tremors, seizures, syncope, speech difficulty, weakness and numbness.    Allergies  Review of patient's allergies indicates no known allergies.  Home Medications   Current Outpatient Rx  Name Route Sig Dispense Refill  . ROSUVASTATIN CALCIUM 20 MG PO TABS Oral Take 20 mg by mouth daily.        Marland Kitchen VALSARTAN 80 MG PO TABS Oral Take 80 mg by mouth daily.      Marland Kitchen FEXOFENADINE-PSEUDOEPHED ER 60-120 MG PO TB12 Oral Take 1 tablet by mouth every 12 (twelve) hours. 30 tablet 0  . MECLIZINE HCL 50 MG PO TABS Oral Take 1 tablet (50 mg total) by mouth 3 (three) times daily as needed. 30 tablet 0  . ONDANSETRON HCL 4 MG PO TABS Oral Take 1 tablet (4 mg total) by mouth every 8 (eight) hours as needed for nausea. 12 tablet 0    BP 122/55  Pulse 63  Temp(Src) 97.4 F (36.3 C) (Oral)  Resp 20  SpO2 98%  Physical Exam  Nursing note and vitals reviewed. Constitutional: He is oriented to person, place, and time. He appears well-developed and well-nourished. No distress.  HENT:  Head: Normocephalic and atraumatic.  Right Ear: External ear normal.  Left Ear: External ear normal.  Mouth/Throat: Oropharynx is clear and moist.       Nasal congestion with nasal voice and clear rhinorrhea.  Eyes: Conjunctivae and EOM are normal. Pupils are equal, round, and reactive to light.       No nystagmus  Neck: Normal range of motion. Neck supple. No JVD present.  Cardiovascular: Normal rate, regular rhythm and normal heart sounds.  Exam reveals no gallop and no friction rub.   No murmur heard. Pulmonary/Chest: No respiratory distress. He has no wheezes. He has no rales. He exhibits no tenderness.  Abdominal: Soft. He exhibits no distension. There is no tenderness.  Neurological: He is alert and oriented to person, place,  and time. No cranial nerve deficit. Coordination normal.       Negative Romberg. No arm drop. Reported mild dizziness with sudden head movement from side to side but no nystagmus elicited.  Skin: No rash noted.    ED Course  Procedures (including critical care time)  Labs Reviewed  POCT I-STAT, CHEM 8 - Abnormal; Notable for the following:    Glucose, Bld 104 (*)    Calcium, Ion 0.88 (*)    All other components within normal limits  LAB REPORT - SCANNED   No results found.   1.  Vertigo, benign positional       MDM  Impress positional vertigo and contribuiting nasal congestion. EKG with sinus bradycardia rate 54 unchanged from prior EKG's, no chest pain or syncope. Normal electrolytes. No orthostatic. No CHF finding on lung exam although pt with chronic low extremity edema due to morbid obesity. Treated symptomatically.        Sharin Grave, MD 07/13/11 1646

## 2011-07-11 NOTE — ED Notes (Signed)
Pt here with sudden episode of dizziness with nausea that started last night.pt states sx lasted 5-10 minutes.denies black spots or feeling faint.no vomiting but c/o bilat temporal h/a.pt has hx htn controlled by oral meds.last taken today.no cp or sob

## 2011-07-20 ENCOUNTER — Emergency Department (INDEPENDENT_AMBULATORY_CARE_PROVIDER_SITE_OTHER)
Admission: EM | Admit: 2011-07-20 | Discharge: 2011-07-20 | Disposition: A | Discharge status: Home / Self Care | Payer: BC Managed Care – PPO | Source: Home / Self Care | Attending: Emergency Medicine | Admitting: Emergency Medicine

## 2011-07-20 ENCOUNTER — Encounter (HOSPITAL_COMMUNITY): Payer: Self-pay | Admitting: *Deleted

## 2011-07-20 DIAGNOSIS — J069 Acute upper respiratory infection, unspecified: Secondary | ICD-10-CM

## 2011-07-20 MED ORDER — ACETAMINOPHEN-CODEINE #3 300-30 MG PO TABS: 1.0000 | ORAL_TABLET | Freq: Four times a day (QID) | ORAL | Status: AC | PRN

## 2011-07-20 NOTE — ED Notes (Signed)
Pt is here with complaints of 1 day history of cough, chills and congestion.

## 2011-07-20 NOTE — ED Provider Notes (Addendum)
History     CSN: [id number] Arrival date & time: 07/20/2011  2:00 PM   First MD Initiated Contact with Patient 07/20/11 1318      Chief Complaint  Patient presents with  . Cough  . Nasal Congestion  . Chills    (Consider location/radiation/quality/duration/timing/severity/associated sxs/prior treatment) HPI Comments: COUGH AND WITH A RUNNY CONGESTED NOSE X 1 DAY HAD A FEVER YESTERDAY X 1  Patient is a 50 y.o. male presenting with cough. The history is provided by the patient.  Cough This is a new problem. There has been no fever. Associated symptoms include rhinorrhea. Pertinent negatives include no ear congestion, no ear pain, no sore throat, no shortness of breath and no wheezing. He has tried nothing for the symptoms. He is not a smoker. His past medical history does not include pneumonia, COPD or asthma.    Past Medical History  Diagnosis Date  . Hypertension     History reviewed. No pertinent past surgical history.  History reviewed. No pertinent family history.  History  Substance Use Topics  . Smoking status: Never Smoker   . Smokeless tobacco: Not on file  . Alcohol Use: Yes     occasionally      Review of Systems  HENT: Positive for rhinorrhea. Negative for ear pain and sore throat.   Respiratory: Positive for cough. Negative for shortness of breath and wheezing.     Allergies  Review of patient's allergies indicates no known allergies.  Home Medications   Current Outpatient Rx  Name Route Sig Dispense Refill  . ACETAMINOPHEN-CODEINE #3 300-30 MG PO TABS Oral Take 1-2 tablets by mouth every 6 (six) hours as needed for pain. 15 tablet 0  . FEXOFENADINE-PSEUDOEPHED ER 60-120 MG PO TB12 Oral Take 1 tablet by mouth every 12 (twelve) hours. 30 tablet 0  . MECLIZINE HCL 50 MG PO TABS Oral Take 1 tablet (50 mg total) by mouth 3 (three) times daily as needed. 30 tablet 0  . ROSUVASTATIN CALCIUM 20 MG PO TABS Oral Take 20 mg by mouth daily.      Marland Kitchen VALSARTAN  80 MG PO TABS Oral Take 80 mg by mouth daily.        BP 121/72  Pulse 69  Temp(Src) 98.7 F (37.1 C) (Oral)  SpO2 96%  Physical Exam  Nursing note and vitals reviewed. Constitutional: He appears well-developed and well-nourished.  Eyes: Pupils are equal, round, and reactive to light.  Neck: Normal range of motion.  Pulmonary/Chest: Effort normal and breath sounds normal. No respiratory distress. He has no decreased breath sounds. He has no wheezes. He has no rhonchi. He has no rales.    ED Course  Procedures (including critical care time)  Labs Reviewed - No data to display No results found.   1. Upper respiratory infection       MDM  URI  X 24 HOURS. AFEBRILE NORMAL LUNG EXAM- REQUIRED TO SEE A DOCTOR FROM HIS JOB        Jimmie Molly, MD 07/20/11 1528  Jimmie Molly, MD 07/20/11 1530  Jimmie Molly, MD 07/20/11 [phone]

## 2011-07-20 NOTE — ED Provider Notes (Signed)
History     CSN: [id number] Arrival date & time: 07/20/2011  2:00 PM   First MD Initiated Contact with Patient 07/20/11 1318      Chief Complaint  Patient presents with  . Cough  . Nasal Congestion  . Chills    (Consider location/radiation/quality/duration/timing/severity/associated sxs/prior treatment) HPI  Past Medical History  Diagnosis Date  . Hypertension     History reviewed. No pertinent past surgical history.  History reviewed. No pertinent family history.  History  Substance Use Topics  . Smoking status: Never Smoker   . Smokeless tobacco: Not on file  . Alcohol Use: Yes     occasionally      Review of Systems  Allergies  Review of patient's allergies indicates no known allergies.  Home Medications   Current Outpatient Rx  Name Route Sig Dispense Refill  . FEXOFENADINE-PSEUDOEPHED ER 60-120 MG PO TB12 Oral Take 1 tablet by mouth every 12 (twelve) hours. 30 tablet 0  . MECLIZINE HCL 50 MG PO TABS Oral Take 1 tablet (50 mg total) by mouth 3 (three) times daily as needed. 30 tablet 0  . ROSUVASTATIN CALCIUM 20 MG PO TABS Oral Take 20 mg by mouth daily.      Marland Kitchen VALSARTAN 80 MG PO TABS Oral Take 80 mg by mouth daily.        BP 121/72  Pulse 69  Temp(Src) 98.7 F (37.1 C) (Oral)  SpO2 96%  Physical Exam  ED Course  Procedures (including critical care time)  Labs Reviewed - No data to display No results found.   No diagnosis found.   I did not see this patient MDM          Randa Spike, MD 07/20/11 [phone]

## 2011-07-20 NOTE — ED Notes (Signed)
I did not see this patient  Randa Spike, MD 07/20/11 [phone]

## 2011-11-21 ENCOUNTER — Encounter (HOSPITAL_COMMUNITY): Payer: Self-pay | Admitting: *Deleted

## 2011-11-21 ENCOUNTER — Emergency Department (INDEPENDENT_AMBULATORY_CARE_PROVIDER_SITE_OTHER)
Admission: EM | Admit: 2011-11-21 | Discharge: 2011-11-21 | Disposition: A | Discharge status: Home / Self Care | Payer: BC Managed Care – PPO | Source: Home / Self Care | Attending: Family Medicine | Admitting: Family Medicine

## 2011-11-21 DIAGNOSIS — J069 Acute upper respiratory infection, unspecified: Secondary | ICD-10-CM

## 2011-11-21 DIAGNOSIS — R112 Nausea with vomiting, unspecified: Secondary | ICD-10-CM

## 2011-11-21 HISTORY — DX: Pure hypercholesterolemia, unspecified: E78.00

## 2011-11-21 MED ORDER — ONDANSETRON 8 MG PO TBDP: 8.0000 mg | ORAL_TABLET | Freq: Three times a day (TID) | ORAL | Status: AC | PRN

## 2011-11-21 NOTE — ED Notes (Signed)
Pt with c/o sinus congestion/mild cough/intermittent sore throat onset Thursday - this morning vomited - vomited x 2

## 2011-11-21 NOTE — ED Provider Notes (Signed)
History     CSN: [id number]  Arrival date & time 11/21/11  7846   First MD Initiated Contact with Patient 11/21/11 1007      Chief Complaint  Patient presents with  . Nasal Congestion  . Cough  . Emesis  . Nausea    (Consider location/radiation/quality/duration/timing/severity/associated sxs/prior treatment) HPI Comments: Patient states that he began a few days ago with mild nasal congestion, sore throat and a nonproductive cough. His symptoms have been mild and overall not too bothersome. He has been taking an over-the-counter cold medication as needed. This morning he developed nausea and has vomited twice. No abdominal pain or diarrhea. He is concerned that this may be related to the cold symptoms that he's had the last several days. He also states that since he left work today he will need a note for his employer. He has tolerated po fluids since his last episode of vomiting.    Past Medical History  Diagnosis Date  . Hypertension   . High cholesterol     History reviewed. No pertinent past surgical history.  History reviewed. No pertinent family history.  History  Substance Use Topics  . Smoking status: Never Smoker   . Smokeless tobacco: Not on file  . Alcohol Use: Yes     occasionally      Review of Systems  Constitutional: Negative for fever, chills and fatigue.  HENT: Positive for congestion, sore throat, rhinorrhea, sneezing, postnasal drip and sinus pressure. Negative for ear pain.   Respiratory: Positive for cough. Negative for shortness of breath and wheezing.   Cardiovascular: Negative for chest pain and palpitations.  Gastrointestinal: Positive for nausea and vomiting. Negative for abdominal pain and diarrhea.    Allergies  Review of patient's allergies indicates no known allergies.  Home Medications   Current Outpatient Rx  Name Route Sig Dispense Refill  . COLD/FLU MEDICINE PO Oral Take by mouth.    . ROSUVASTATIN CALCIUM 20 MG PO TABS Oral  Take 20 mg by mouth daily.      Marland Kitchen VALSARTAN 80 MG PO TABS Oral Take 80 mg by mouth daily.      Marland Kitchen FEXOFENADINE-PSEUDOEPHED ER 60-120 MG PO TB12 Oral Take 1 tablet by mouth every 12 (twelve) hours. 30 tablet 0    BP 140/67  Pulse 66  Temp(Src) 98.6 F (37 C) (Oral)  Resp 18  SpO2 99%  Physical Exam  Nursing note and vitals reviewed. Constitutional: He appears well-developed and well-nourished. No distress.  HENT:  Head: Normocephalic and atraumatic.  Right Ear: Tympanic membrane, external ear and ear canal normal.  Left Ear: Tympanic membrane, external ear and ear canal normal.  Nose: Nose normal.  Mouth/Throat: Uvula is midline, oropharynx is clear and moist and mucous membranes are normal. No oropharyngeal exudate, posterior oropharyngeal edema or posterior oropharyngeal erythema.  Neck: Neck supple.  Cardiovascular: Normal rate, regular rhythm and normal heart sounds.   Pulmonary/Chest: Effort normal and breath sounds normal. No respiratory distress.  Abdominal: Soft. Bowel sounds are normal. He exhibits no distension and no mass. There is no tenderness.  Lymphadenopathy:    He has no cervical adenopathy.  Neurological: He is alert.  Skin: Skin is warm and dry.  Psychiatric: He has a normal mood and affect.    ED Course  Procedures (including critical care time)  Labs Reviewed - No data to display No results found.   No diagnosis found.    MDM  Melody Comas, PA 11/21/11 [address] [place], [place] 11/21/11 1121

## 2011-11-21 NOTE — ED Provider Notes (Signed)
Medical screening examination/treatment/procedure(s) were performed by non-physician practitioner and as supervising physician I was immediately available for consultation/collaboration.  Raynald Blend, MD 11/21/11 2000

## 2011-11-21 NOTE — Discharge Instructions (Signed)
Tylenol or Ibuprofen as needed for discomfort. Increase clear fluids. It is important to stay well hydrated. As symptoms improve advance to a light bland diet. Begin with plain toast, crackers, chicken noodle soup, banana, applesauce, rice.  Continue your cold medication as needed.   Nausea and Vomiting Nausea is a sick feeling that often comes before throwing up (vomiting). Vomiting is a reflex where stomach contents come out of your mouth. Vomiting can cause severe loss of body fluids (dehydration). Children and elderly adults can become dehydrated quickly, especially if they also have diarrhea. Nausea and vomiting are symptoms of a condition or disease. It is important to find the cause of your symptoms. CAUSES   Direct irritation of the stomach lining. This irritation can result from increased acid production (gastroesophageal reflux disease), infection, food poisoning, taking certain medicines (such as nonsteroidal anti-inflammatory drugs), alcohol use, or tobacco use.   Signals from the brain.These signals could be caused by a headache, heat exposure, an inner ear disturbance, increased pressure in the brain from injury, infection, a tumor, or a concussion, pain, emotional stimulus, or metabolic problems.   An obstruction in the gastrointestinal tract (bowel obstruction).   Illnesses such as diabetes, hepatitis, gallbladder problems, appendicitis, kidney problems, cancer, sepsis, atypical symptoms of a heart attack, or eating disorders.   Medical treatments such as chemotherapy and radiation.   Receiving medicine that makes you sleep (general anesthetic) during surgery.  DIAGNOSIS Your caregiver may ask for tests to be done if the problems do not improve after a few days. Tests may also be done if symptoms are severe or if the reason for the nausea and vomiting is not clear. Tests may include:  Urine tests.   Blood tests.   Stool tests.   Cultures (to look for evidence of  infection).   X-rays or other imaging studies.  Test results can help your caregiver make decisions about treatment or the need for additional tests. TREATMENT You need to stay well hydrated. Drink frequently but in small amounts.You may wish to drink water, sports drinks, clear broth, or eat frozen ice pops or gelatin dessert to help stay hydrated.When you eat, eating slowly may help prevent nausea.There are also some antinausea medicines that may help prevent nausea. HOME CARE INSTRUCTIONS   Take all medicine as directed by your caregiver.   If you do not have an appetite, do not force yourself to eat. However, you must continue to drink fluids.   If you have an appetite, eat a normal diet unless your caregiver tells you differently.   Eat a variety of complex carbohydrates (rice, wheat, potatoes, bread), lean meats, yogurt, fruits, and vegetables.   Avoid high-fat foods because they are more difficult to digest.   Drink enough water and fluids to keep your urine clear or pale yellow.   If you are dehydrated, ask your caregiver for specific rehydration instructions. Signs of dehydration may include:   Severe thirst.   Dry lips and mouth.   Dizziness.   Dark urine.   Decreasing urine frequency and amount.   Confusion.   Rapid breathing or pulse.  SEEK IMMEDIATE MEDICAL CARE IF:   You have blood or brown flecks (like coffee grounds) in your vomit.   You have black or bloody stools.   You have a severe headache or stiff neck.   You are confused.   You have severe abdominal pain.   You have chest pain or trouble breathing.   You do not urinate at  least once every 8 hours.   You develop cold or clammy skin.   You continue to vomit for longer than 24 to 48 hours.   You have a fever.  MAKE SURE YOU:   Understand these instructions.   Will watch your condition.   Will get help right away if you are not doing well or get worse.  Document Released: 07/30/2005  Document Revised: 07/19/2011 Document Reviewed: 12/27/2010 [facility] Patient Information 2012 [place], [place]. Upper Respiratory Infection, Adult An upper respiratory infection (URI) is also known as the common cold. It is often caused by a type of germ (virus). Colds are easily spread (contagious). You can pass it to others by kissing, coughing, sneezing, or drinking out of the same glass. Usually, you get better in 1 or 2 weeks.  HOME CARE   Only take medicine as told by your doctor.   Use a warm mist humidifier or breathe in steam from a hot shower.   Drink enough water and fluids to keep your pee (urine) clear or pale yellow.   Get plenty of rest.   Return to work when your temperature is back to normal or as told by your doctor. You may use a face mask and wash your hands to stop your cold from spreading.  GET HELP RIGHT AWAY IF:   After the first few days, you feel you are getting worse.   You have questions about your medicine.   You have chills, shortness of breath, or brown or red spit (mucus).   You have yellow or brown snot (nasal discharge) or pain in the face, especially when you bend forward.   You have a fever, puffy (swollen) neck, pain when you swallow, or white spots in the back of your throat.   You have a bad headache, ear pain, sinus pain, or chest pain.   You have a high-pitched whistling sound when you breathe in and out (wheezing).   You have a lasting cough or cough up blood.   You have sore muscles or a stiff neck.  MAKE SURE YOU:   Understand these instructions.   Will watch your condition.   Will get help right away if you are not doing well or get worse.  Document Released: 01/16/2008 Document Revised: 07/19/2011 Document Reviewed: 12/04/2010 [facility] Patient Information 2012 [place], [place].

## 2012-01-15 ENCOUNTER — Emergency Department (HOSPITAL_COMMUNITY)
Admission: EM | Admit: 2012-01-15 | Discharge: 2012-01-15 | Disposition: A | Discharge status: Home / Self Care | Payer: BC Managed Care – PPO | Source: Home / Self Care | Attending: Emergency Medicine | Admitting: Emergency Medicine

## 2012-01-15 ENCOUNTER — Encounter (HOSPITAL_COMMUNITY): Payer: Self-pay | Admitting: Emergency Medicine

## 2012-01-15 DIAGNOSIS — G43909 Migraine, unspecified, not intractable, without status migrainosus: Secondary | ICD-10-CM

## 2012-01-15 MED ORDER — KETOROLAC TROMETHAMINE 60 MG/2ML IM SOLN: 60.0000 mg | Freq: Once | INTRAMUSCULAR | Status: DC

## 2012-01-15 MED ORDER — DEXAMETHASONE SODIUM PHOSPHATE 10 MG/ML IJ SOLN: 10.0000 mg | Freq: Once | INTRAMUSCULAR | Status: DC

## 2012-01-15 MED ORDER — SUMATRIPTAN SUCCINATE 50 MG PO TABS: 50.0000 mg | ORAL_TABLET | ORAL | Status: DC | PRN

## 2012-01-15 NOTE — ED Notes (Signed)
Intermittent headaches for the last couple of weeks.  Current headache started yesterday.  This am noticed dizziness when patient stepped out of shower.  "I feel weird, queesy maybe".  Patient did not fall with dizziness.  Reports headache is behind both eyes and temporal area.  Denies vision changes.  Denies extremity weakness.

## 2012-01-15 NOTE — ED Provider Notes (Signed)
Chief Complaint  Patient presents with  . Headache    History of Present Illness:   The patient is a 51 year old male who's had a two-day history of a headache. This is bitemporal and described as a dull throb, 2-3/10 in intensity. He felt nauseated but no vomiting. He notes dizziness and whirling vertigo. He denies any presyncope. He denies photophobia or phonophobia. The pain is worse with exercise and activity and better with rest. He's had no fever, chills, sweats, diplopia, blurred vision, or visual aura symptoms. He denies any nasal congestion, rhinorrhea, sore throat, stiff neck, or adenopathy. He's had no numbness, tingling, weakness, difficulty with speech, swallowing, ambulation, or coordination. He denies any prior history of headaches or migraines in the past. He does admit to being under more stress in the past 2 weeks.  Review of Systems:  Other than noted above, the patient denies any of the following symptoms: Systemic:  No fever, chills, fatigue, photophobia, stiff neck. Eye:  No redness, eye pain, discharge, blurred vision, or diplopia. ENT:  No nasal congestion, rhinorrhea, sinus pressure or pain, sneezing, earache, or sore throat.  No jaw claudication. Neuro:  No paresthesias, loss of consciousness, seizure activity, muscle weakness, trouble with coordination or gait, trouble speaking or swallowing. Psych:  No depression, anxiety or trouble sleeping.  PMFSH:  Past medical history, family history, social history, meds, and allergies were reviewed.  Physical Exam:   Vital signs:  BP 116/56  Pulse 58  Temp(Src) 98 F (36.7 C) (Oral)  Resp 18  SpO2 100% General:  Alert and oriented.  In no distress. Eye:  Lids and conjunctivas normal.  PERRL,  Full EOMs.  Fundi benign with normal discs and vessels. ENT:  No cranial or facial tenderness to palpation.  TMs and canals clear.  Nasal mucosa was normal and uncongested without any drainage. No intra oral lesions, pharynx clear,  mucous membranes moist, dentition normal. Neck:  Supple, full ROM, no tenderness to palpation.  No adenopathy or mass. Neuro:  Alert and orented times 3.  Speech was clear, fluent, and appropriate.  Cranial nerves intact. No pronator drift, muscle strength normal. Finger to nose normal.  DTRs were 2+ and symmetrical.Station and gait were normal.  Romberg's sign was normal.  Able to perform tandem gait well. Psych:  Normal affect.  Medications given in [facility]:  He was given Toradol 60 mg IM and Decadron 10 mg IM and tolerated this well without any immediate side effects.  Assessment:  The encounter diagnosis was Migraine headache.  Plan:   1.  The following meds were prescribed:   New Prescriptions   SUMATRIPTAN (IMITREX) 50 MG TABLET    Take 1 tablet (50 mg total) by mouth every 2 (two) hours as needed for migraine.   2.  The patient was instructed in symptomatic care and handouts were given. 3.  The patient was told to return if becoming worse in any way, if no better in 3 or 4 days, and given some red flag symptoms that would indicate earlier return.    Reuben Likes, MD 01/15/12 [phone]

## 2012-01-15 NOTE — ED Notes (Signed)
Repositioned bed for comfort, dimmed lights.

## 2012-01-15 NOTE — ED Notes (Signed)
Patient aware of potential delay

## 2012-01-15 NOTE — ED Notes (Signed)
Headache is not the post painful, it is not going away and associated with dizzy spell this am

## 2012-01-15 NOTE — Discharge Instructions (Signed)

## 2012-03-07 ENCOUNTER — Emergency Department (HOSPITAL_COMMUNITY)
Admission: EM | Admit: 2012-03-07 | Discharge: 2012-03-07 | Disposition: A | Discharge status: Home / Self Care | Payer: BC Managed Care – PPO | Source: Home / Self Care | Attending: Emergency Medicine | Admitting: Emergency Medicine

## 2012-03-07 ENCOUNTER — Encounter (HOSPITAL_COMMUNITY): Payer: Self-pay

## 2012-03-07 DIAGNOSIS — S335XXA Sprain of ligaments of lumbar spine, initial encounter: Secondary | ICD-10-CM

## 2012-03-07 MED ORDER — MELOXICAM 7.5 MG PO TABS: 7.5000 mg | ORAL_TABLET | Freq: Every day | ORAL | Status: DC

## 2012-03-07 MED ORDER — CYCLOBENZAPRINE HCL 10 MG PO TABS: 10.0000 mg | ORAL_TABLET | Freq: Three times a day (TID) | ORAL | Status: AC | PRN

## 2012-03-07 NOTE — ED Provider Notes (Signed)
History     CSN: [id number]  Arrival date & time 03/07/12  1121   First MD Initiated Contact with Patient 03/07/12 1124      Chief Complaint  Patient presents with  . Back Pain    (Consider location/radiation/quality/duration/timing/severity/associated sxs/prior treatment) HPI Comments: Patient reports that Thursday while he was in the bathroom he slipped and almost fell to the ground but he didn't he was able to hold himself up and felt like he pulled a muscle on the left side of his back. Has been sore and tender since Thursday had been using a heat pad and Tylenol yesterday but they don't seem to be helping him so much and now has a constant soreness in the left lower part of his back. Patient denies any weakness, numbness, paresthesias, changes in urination or bowel control.  Patient is a 51 y.o. male presenting with back pain.  Back Pain  This is a new problem. The current episode started 2 days ago. The problem occurs constantly. The problem has been gradually worsening. The pain is present in the lumbar spine. The quality of the pain is described as aching. The pain does not radiate. The pain is at a severity of 8/10. The pain is moderate. Pertinent negatives include no chest pain, no fever, no numbness, no abdominal pain, no abdominal swelling, no bowel incontinence, no perianal numbness, no dysuria, no paresthesias, no paresis, no tingling and no weakness. He has tried heat for the symptoms. The treatment provided no relief.    Past Medical History  Diagnosis Date  . Hypertension   . High cholesterol   . H/O cardiac catheterization     "looked fine"    History reviewed. No pertinent past surgical history.  History reviewed. No pertinent family history.  History  Substance Use Topics  . Smoking status: Never Smoker   . Smokeless tobacco: Not on file  . Alcohol Use: Yes     occasionally      Review of Systems  Constitutional: Positive for activity change. Negative  for fever, chills and appetite change.  Cardiovascular: Negative for chest pain.  Gastrointestinal: Negative for abdominal pain and bowel incontinence.  Genitourinary: Negative for dysuria and enuresis.  Musculoskeletal: Positive for back pain. Negative for myalgias and joint swelling.  Neurological: Negative for tingling, weakness, numbness and paresthesias.    Allergies  Review of patient's allergies indicates no known allergies.  Home Medications   Current Outpatient Rx  Name Route Sig Dispense Refill  . ROSUVASTATIN CALCIUM 20 MG PO TABS Oral Take 20 mg by mouth daily.      Marland Kitchen VALSARTAN 80 MG PO TABS Oral Take 80 mg by mouth daily.      Deeann Dowse BODY PAIN PO Oral Take by mouth.    . CYCLOBENZAPRINE HCL 10 MG PO TABS Oral Take 1 tablet (10 mg total) by mouth 3 (three) times daily as needed for muscle spasms. 10 tablet 0  . COLD/FLU MEDICINE PO Oral Take by mouth.    . MELOXICAM 7.5 MG PO TABS Oral Take 1 tablet (7.5 mg total) by mouth daily. 7 tablet 0  . SUMATRIPTAN SUCCINATE 50 MG PO TABS Oral Take 1 tablet (50 mg total) by mouth every 2 (two) hours as needed for migraine. 10 tablet 0    BP 136/80  Pulse 55  Temp 98.3 F (36.8 C) (Oral)  Resp 16  SpO2 97%  Physical Exam  Nursing note and vitals reviewed. Constitutional: No distress.  Musculoskeletal: He  exhibits tenderness.       Lumbar back: He exhibits tenderness, pain and spasm. He exhibits normal range of motion, no swelling, no edema, no deformity and normal pulse.       Back:  Neurological: He is alert.  Skin: No rash noted. He is not diaphoretic. No erythema.    ED Course  Procedures (including critical care time)  Labs Reviewed - No data to display No results found.   1. Lumbar back sprain       MDM  Patient exam was consistent with a left lumbar sprain in his paravertebral area. His muscular tone and strength was normal both lower extremities and no sensorial deficits. Patient was prescribed a  course of meloxicam for 7 days and a muscle relaxer for 48 hours. Was advised to B. precautions about Flexeril as this could make him feel drowsy or dizzy he acknowledges. Was also requesting a work note for the weekend.        Jimmie Molly, MD 03/07/12 1302

## 2012-03-07 NOTE — ED Notes (Signed)
Feels as if he has strained his back; pain onset Thursday AM, used heat and tylenol all day yesterday;needs a note for work and medication

## 2012-05-05 ENCOUNTER — Emergency Department (INDEPENDENT_AMBULATORY_CARE_PROVIDER_SITE_OTHER)
Admission: EM | Admit: 2012-05-05 | Discharge: 2012-05-05 | Disposition: A | Discharge status: Home / Self Care | Payer: BC Managed Care – PPO | Source: Home / Self Care | Attending: Family Medicine | Admitting: Family Medicine

## 2012-05-05 ENCOUNTER — Encounter (HOSPITAL_COMMUNITY): Payer: Self-pay

## 2012-05-05 DIAGNOSIS — J019 Acute sinusitis, unspecified: Secondary | ICD-10-CM

## 2012-05-05 MED ORDER — FEXOFENADINE HCL 180 MG PO TABS: 180.0000 mg | ORAL_TABLET | Freq: Every day | ORAL | Status: DC

## 2012-05-05 MED ORDER — AMOXICILLIN-POT CLAVULANATE 875-125 MG PO TABS: 1.0000 | ORAL_TABLET | Freq: Two times a day (BID) | ORAL | Status: DC

## 2012-05-05 MED ORDER — FLUTICASONE PROPIONATE 50 MCG/ACT NA SUSP: 1.0000 | Freq: Two times a day (BID) | NASAL | Status: DC

## 2012-05-05 NOTE — ED Provider Notes (Signed)
History     CSN: [id number]  Arrival date & time 05/05/12  1158   First MD Initiated Contact with Patient 05/05/12 1423      Chief Complaint  Patient presents with  . Cough    (Consider location/radiation/quality/duration/timing/severity/associated sxs/prior treatment) Patient is a 51 y.o. male presenting with cough. The history is provided by the patient.  Cough This is a new problem. The current episode started more than 1 week ago. The problem has not changed since onset.The cough is productive of sputum. There has been no fever. Associated symptoms include rhinorrhea. Pertinent negatives include no shortness of breath and no wheezing. He has tried cough syrup for the symptoms. The treatment provided no relief.    Past Medical History  Diagnosis Date  . Hypertension   . High cholesterol   . H/O cardiac catheterization     "looked fine"    History reviewed. No pertinent past surgical history.  No family history on file.  History  Substance Use Topics  . Smoking status: Never Smoker   . Smokeless tobacco: Not on file  . Alcohol Use: Yes     occasionally      Review of Systems  Constitutional: Negative.   HENT: Positive for nosebleeds, congestion, rhinorrhea and postnasal drip.   Respiratory: Positive for cough. Negative for shortness of breath and wheezing.     Allergies  Review of patient's allergies indicates no known allergies.  Home Medications   Current Outpatient Rx  Name Route Sig Dispense Refill  . ROSUVASTATIN CALCIUM 20 MG PO TABS Oral Take 20 mg by mouth daily.      Marland Kitchen VALSARTAN 80 MG PO TABS Oral Take 80 mg by mouth daily.      . AMOXICILLIN-POT CLAVULANATE 875-125 MG PO TABS Oral Take 1 tablet by mouth 2 (two) times daily. 20 tablet 0  . GOODYS BODY PAIN PO Oral Take by mouth.    Marland Kitchen FEXOFENADINE HCL 180 MG PO TABS Oral Take 1 tablet (180 mg total) by mouth daily. 30 tablet 1  . FLUTICASONE PROPIONATE 50 MCG/ACT NA SUSP Nasal Place 1 spray into  the nose 2 (two) times daily. 1 g 2  . COLD/FLU MEDICINE PO Oral Take by mouth.    . MELOXICAM 7.5 MG PO TABS Oral Take 1 tablet (7.5 mg total) by mouth daily. 7 tablet 0  . SUMATRIPTAN SUCCINATE 50 MG PO TABS Oral Take 1 tablet (50 mg total) by mouth every 2 (two) hours as needed for migraine. 10 tablet 0    BP 152/82  Pulse 58  Temp 97.9 F (36.6 C) (Oral)  Resp 24  SpO2 99%  Physical Exam  Nursing note and vitals reviewed. Constitutional: He is oriented to person, place, and time. He appears well-developed and well-nourished.  HENT:  Head: Normocephalic.  Right Ear: External ear normal.  Left Ear: External ear normal.  Nose: Nose normal.  Mouth/Throat: Oropharynx is clear and moist.  Eyes: Pupils are equal, round, and reactive to light.  Neck: Normal range of motion. Neck supple.  Cardiovascular: Normal rate, regular rhythm, normal heart sounds and intact distal pulses.   Pulmonary/Chest: Effort normal and breath sounds normal.  Lymphadenopathy:    He has no cervical adenopathy.  Neurological: He is alert and oriented to person, place, and time.  Skin: Skin is warm and dry.    ED Course  Procedures (including critical care time)  Labs Reviewed - No data to display No results found.   1. Sinusitis  acute       MDM          Linna Hoff, MD 05/05/12 [phone]

## 2012-05-05 NOTE — ED Notes (Signed)
Patient complains of cough, sometimes productive yellow/green in color, some sinus pain and pressure, blood when he blows his nose

## 2012-07-18 ENCOUNTER — Encounter (HOSPITAL_COMMUNITY): Payer: Self-pay | Admitting: Emergency Medicine

## 2012-07-18 ENCOUNTER — Emergency Department (HOSPITAL_COMMUNITY)
Admission: EM | Admit: 2012-07-18 | Discharge: 2012-07-18 | Disposition: A | Discharge status: Home / Self Care | Payer: BC Managed Care – PPO | Source: Home / Self Care

## 2012-07-18 DIAGNOSIS — B349 Viral infection, unspecified: Secondary | ICD-10-CM

## 2012-07-18 DIAGNOSIS — H6590 Unspecified nonsuppurative otitis media, unspecified ear: Secondary | ICD-10-CM

## 2012-07-18 DIAGNOSIS — B9789 Other viral agents as the cause of diseases classified elsewhere: Secondary | ICD-10-CM

## 2012-07-18 DIAGNOSIS — R42 Dizziness and giddiness: Secondary | ICD-10-CM

## 2012-07-18 MED ORDER — AMOXICILLIN 500 MG PO CAPS: 500.0000 mg | ORAL_CAPSULE | Freq: Four times a day (QID) | ORAL | Status: DC

## 2012-07-18 MED ORDER — MECLIZINE HCL 50 MG PO TABS: 25.0000 mg | ORAL_TABLET | Freq: Three times a day (TID) | ORAL | Status: DC | PRN

## 2012-07-18 NOTE — ED Notes (Signed)
Reports being dizzy and nausea today while working.  Patient states when he got dizzy he sat down for a few minutes and the feeling went away.  Patient says he was just lightheaded.   Denies anything this morning.

## 2012-07-18 NOTE — ED Provider Notes (Signed)
History     CSN: [id number]  Arrival date & time 07/18/12  1203   None     Chief Complaint  Patient presents with  . Dizziness  . Nausea    (Consider location/radiation/quality/duration/timing/severity/associated sxs/prior treatment) HPI Comments: 51 year old male at work this morning he became lightheaded, feeling dizzy nauseated. This lasted for one to 2 minutes. Symptoms occurred randomly and intermittently through the day. He denies upper respiratory symptoms, chest pain, shortness of breath, earache or sore throat. He denies bodyaches but he states this afternoon he began to feel as if he is "coming down with something" otherwise there nothing more specific. History of hypertension and morbid obesity he currently taking Diovan. When he was in the lobby was called to come back he stood up from a sitting position and developed transient dizziness. After sitting in a room for several minutes the symptoms abated.   Past Medical History  Diagnosis Date  . Hypertension   . High cholesterol   . H/O cardiac catheterization     "looked fine"    History reviewed. No pertinent past surgical history.  History reviewed. No pertinent family history.  History  Substance Use Topics  . Smoking status: Never Smoker   . Smokeless tobacco: Not on file  . Alcohol Use: Yes     Comment: occasionally      Review of Systems  Constitutional: Positive for chills, activity change and fatigue. Negative for fever.  HENT: Negative.   Eyes: Negative.   Respiratory: Negative.   Gastrointestinal: Positive for nausea. Negative for vomiting.  Genitourinary: Negative.   Musculoskeletal: Negative for back pain.  Neurological: Positive for light-headedness. Negative for tremors, seizures, syncope, speech difficulty, numbness and headaches.  Psychiatric/Behavioral: Negative.     Allergies  Review of patient's allergies indicates no known allergies.  Home Medications   Current Outpatient Rx   Name  Route  Sig  Dispense  Refill  . ROSUVASTATIN CALCIUM 20 MG PO TABS   Oral   Take 20 mg by mouth daily.           . SUMATRIPTAN SUCCINATE 50 MG PO TABS   Oral   Take 1 tablet (50 mg total) by mouth every 2 (two) hours as needed for migraine.   10 tablet   0   . VALSARTAN 80 MG PO TABS   Oral   Take 80 mg by mouth daily.           . AMOXICILLIN 500 MG PO CAPS   Oral   Take 1 capsule (500 mg total) by mouth QID.   32 capsule   0   . GOODYS BODY PAIN PO   Oral   Take by mouth.         Marland Kitchen FEXOFENADINE HCL 180 MG PO TABS   Oral   Take 1 tablet (180 mg total) by mouth daily.   30 tablet   1   . FLUTICASONE PROPIONATE 50 MCG/ACT NA SUSP   Nasal   Place 1 spray into the nose 2 (two) times daily.   1 g   2   . COLD/FLU MEDICINE PO   Oral   Take by mouth.         Marland Kitchen MECLIZINE HCL 50 MG PO TABS   Oral   Take 0.5 tablets (25 mg total) by mouth 3 (three) times daily as needed.   21 tablet   0   . MELOXICAM 7.5 MG PO TABS   Oral   Take  1 tablet (7.5 mg total) by mouth daily.   7 tablet   0     BP 132/60  Pulse 59  Temp 97 F (36.1 C) (Oral)  Resp 22  SpO2 98%  Physical Exam  Nursing note and vitals reviewed. Constitutional: He is oriented to person, place, and time. He appears well-developed and well-nourished. No distress.       Morbidly obese  HENT:       TMs are partially erythematous or injected. Both have small areas of effusion. Oropharynx with mild erythema no exudates or swelling. Airway is widely patent.  Eyes: Conjunctivae normal and EOM are normal. Pupils are equal, round, and reactive to light.  Neck: Normal range of motion. Neck supple.  Cardiovascular: Normal rate, regular rhythm and normal heart sounds.   No murmur heard. Pulmonary/Chest: Effort normal and breath sounds normal. No respiratory distress. He has no wheezes. He has no rales.  Lymphadenopathy:    He has no cervical adenopathy.  Neurological: He is alert and oriented  to person, place, and time. He exhibits normal muscle tone. Coordination normal.  Skin: Skin is warm and dry. No erythema.  Psychiatric: He has a normal mood and affect.    ED Course  Procedures (including critical care time)  Labs Reviewed - No data to display No results found.   1. Dizziness   2. Viral syndrome   3. Otitis media with effusion       MDM  Meclizine 25 mg 3 times a day when necessary dizzy Amoxicillin 500 mg to be at the 40 days. Drink plenty of fluids stay well hydrated Out of work today and following 2 days. Eye getting better early next week call your doctor for an appointment or if worse return. Orthostatic values reviewed and he does not have orthostatic hypotension.          Hayden Rasmussen, NP 07/18/12 1701

## 2012-07-19 NOTE — ED Provider Notes (Signed)
Medical screening examination/treatment/procedure(s) were performed by non-physician practitioner and as supervising physician I was immediately available for consultation/collaboration.  Leslee Home, M.D.   Reuben Likes, MD 07/19/12 [phone]

## 2012-07-30 ENCOUNTER — Emergency Department (HOSPITAL_COMMUNITY)
Admission: EM | Admit: 2012-07-30 | Discharge: 2012-07-30 | Disposition: A | Discharge status: Home / Self Care | Payer: BC Managed Care – PPO | Attending: Emergency Medicine | Admitting: Emergency Medicine

## 2012-07-30 ENCOUNTER — Encounter (HOSPITAL_COMMUNITY): Payer: Self-pay | Admitting: Emergency Medicine

## 2012-07-30 DIAGNOSIS — I10 Essential (primary) hypertension: Secondary | ICD-10-CM | POA: Insufficient documentation

## 2012-07-30 DIAGNOSIS — R42 Dizziness and giddiness: Secondary | ICD-10-CM | POA: Insufficient documentation

## 2012-07-30 DIAGNOSIS — Z79899 Other long term (current) drug therapy: Secondary | ICD-10-CM | POA: Insufficient documentation

## 2012-07-30 DIAGNOSIS — E7800 Pure hypercholesterolemia, unspecified: Secondary | ICD-10-CM | POA: Insufficient documentation

## 2012-07-30 LAB — BASIC METABOLIC PANEL
BUN: 14 mg/dL (ref 6–23)
Creatinine, Ser: 0.87 mg/dL (ref 0.50–1.35)
GFR calc Af Amer: >90 mL/min (ref >90)
GFR calc non Af Amer: >90 mL/min (ref >90)
Potassium: 3.4 mEq/L — ABNORMAL LOW (ref 3.5–5.1)

## 2012-07-30 LAB — CBC
HCT: 39.5 % (ref 39.0–52.0)
MCHC: 33.9 g/dL (ref 30.0–36.0)
MCV: 82.6 fL (ref 78.0–100.0)
RDW: 15.7 % — ABNORMAL HIGH (ref 11.5–15.5)

## 2012-07-30 MED ORDER — MECLIZINE HCL 25 MG PO TABS
25.0000 mg | ORAL_TABLET | Freq: Once | ORAL | Status: AC
  Administered 2012-07-30: 25 mg via ORAL
  Filled 2012-07-30: qty 1

## 2012-07-30 MED ORDER — MECLIZINE HCL 25 MG PO TABS: 25.0000 mg | ORAL_TABLET | Freq: Three times a day (TID) | ORAL | Status: DC | PRN

## 2012-07-30 NOTE — ED Provider Notes (Signed)
Medical screening examination/treatment/procedure(s) were performed by non-physician practitioner and as supervising physician I was immediately available for consultation/collaboration.  Remee Charley, MD 07/30/12 1725 

## 2012-07-30 NOTE — ED Notes (Signed)
Pt able to ambulate independently in the hall to the rest room. States he feels slightly light headed but feels relief from dizziness.

## 2012-07-30 NOTE — ED Notes (Signed)
Per EMS pt came from work with sudden onset dizziness at approximately 0915. Dizziness is worse with standing. Pt denies pain, but c/o nausea. Pt was concerned that dizziness may be from working with [employer] at work but states it is not toxic.

## 2012-07-30 NOTE — Discharge Instructions (Signed)

## 2012-07-30 NOTE — ED Provider Notes (Signed)
History     CSN: [id number]  Arrival date & time 07/30/12  1110   First MD Initiated Contact with Patient 07/30/12 1152      Chief Complaint  Patient presents with  . Dizziness    (Consider location/radiation/quality/duration/timing/severity/associated sxs/prior treatment) HPI Comments: Anthony Fitzpatrick is a 51 y.o. male with a history of hypertension (currently on Diovan) and hyperlipidemia presents emergency department complaining of dizziness.  Onset of symptoms began acutely this morning at 9 a.m. and were described as a lightheaded spinning type sensation.  Patient denies any headache, syncope, nausea, ataxia, dysequilibrium, tinnitus, hearing loss, recent sickness, F/NS/chills, vision changes, or seizure. Pt reports symptoms have improved dramatically since onset & that he had a similar experience 1 week ago, but it resolved much quicker. Current episode lasted about 1 hour. No other complaints at this time.   The history is provided by the patient.    Past Medical History  Diagnosis Date  . Hypertension   . High cholesterol   . H/O cardiac catheterization     "looked fine"    History reviewed. No pertinent past surgical history.  No family history on file.  History  Substance Use Topics  . Smoking status: Never Smoker   . Smokeless tobacco: Not on file  . Alcohol Use: Yes     Comment: occasionally      Review of Systems  Constitutional: Negative for fever, diaphoresis and activity change.  HENT: Negative for hearing loss, ear pain, congestion, neck pain, tinnitus and ear discharge.   Respiratory: Negative for cough.   Gastrointestinal: Negative for nausea and vomiting.  Genitourinary: Negative for dysuria.  Musculoskeletal: Negative for myalgias.  Skin: Negative for color change and wound.  Neurological: Positive for dizziness and light-headedness. Negative for seizures, syncope, facial asymmetry, speech difficulty, weakness, numbness and headaches.  All other  systems reviewed and are negative.    Allergies  Review of patient's allergies indicates no known allergies.  Home Medications   Current Outpatient Rx  Name  Route  Sig  Dispense  Refill  . ROSUVASTATIN CALCIUM 20 MG PO TABS   Oral   Take 20 mg by mouth daily.           Marland Kitchen VALSARTAN-HYDROCHLOROTHIAZIDE 320-25 MG PO TABS   Oral   Take 1 tablet by mouth daily.         . AMOXICILLIN 500 MG PO CAPS   Oral   Take 1 capsule (500 mg total) by mouth QID.   32 capsule   0     BP 112/74  Pulse 56  Temp 97.8 F (36.6 C) (Oral)  Resp 18  SpO2 100%  Physical Exam  Constitutional: He is oriented to person, place, and time. He appears well-developed and well-nourished. No distress.  HENT:  Head: Normocephalic and atraumatic.  Mouth/Throat: Oropharynx is clear and moist. No oropharyngeal exudate.       After cerumen removal: R & L ext ear canal &TM normal. Finger rub audible bilaterally   Eyes: Conjunctivae normal and EOM are normal. Pupils are equal, round, and reactive to light. No scleral icterus.  Neck: Normal range of motion. Neck supple. No tracheal deviation present. No thyromegaly present.       No carotid bruit  Cardiovascular: Regular rhythm, normal heart sounds and intact distal pulses.   Pulmonary/Chest: Effort normal and breath sounds normal. No stridor. No respiratory distress. He has no wheezes.  Abdominal: Soft.       Morbidly obese, non tender  Musculoskeletal: Normal range of motion. He exhibits no edema and no tenderness.  Neurological: He is alert and oriented to person, place, and time. Coordination normal.       CN III-XII intact. No Nystagmus. Good finger-nose, rapid alt movements, & heal-shin. Gait normal without ataxia.   Skin: Skin is warm and dry. He is not diaphoretic. No erythema. No pallor.       Lower extremity skin color changes c/w venous stasis  Psychiatric: He has a normal mood and affect. His behavior is normal.    ED Course  Procedures  (including critical care time)  Labs Reviewed  CBC - Abnormal; Notable for the following:    RDW 15.7 (*)     All other components within normal limits  BASIC METABOLIC PANEL - Abnormal; Notable for the following:    Potassium 3.4 (*)     Glucose, Bld 109 (*)     All other components within normal limits   No results found.  3:20 PM Cerumen removal performed, Left ear.   No diagnosis found.   Date: 07/30/2012  Rate: 47  Rhythm: sinus brady rhythm  QRS Axis: normal  Intervals: normal  ST/T Wave abnormalities: normal  Conduction Disutrbances: none  Narrative Interpretation:   Old EKG Reviewed: No significant changes noted, (HR 54 07/11/2011)  BP 110/60  Pulse 49  Temp 97.8 F (36.6 C) (Oral)  Resp 17  SpO2 100%   MDM  Dizziness  Afebrile pt in NAD presents to the ER c/o dizziness. No orthostatic hypotension, cranial neuro deficits or cerebellar findings on exam. Patient ambulates in ER without difficulty on own. Likely peripheral etiology for cause of symptoms which have improved while in the ER. Discussed that pts blood pressure and bradycardia may be a contributing factor to symptoms. Pt will be dc with meclozine and advised to follow up with PCP for further testing & possible medication adjustment. Strict return precautions discussed.         Jaci Carrel, [place] 07/30/12 1520

## 2013-12-08 ENCOUNTER — Encounter (HOSPITAL_COMMUNITY): Payer: Self-pay | Admitting: Emergency Medicine

## 2013-12-08 ENCOUNTER — Emergency Department (HOSPITAL_COMMUNITY)
Admission: EM | Admit: 2013-12-08 | Discharge: 2013-12-08 | Disposition: A | Discharge status: Home / Self Care | Payer: BC Managed Care – PPO | Attending: Emergency Medicine | Admitting: Emergency Medicine

## 2013-12-08 DIAGNOSIS — I10 Essential (primary) hypertension: Secondary | ICD-10-CM | POA: Insufficient documentation

## 2013-12-08 DIAGNOSIS — E7800 Pure hypercholesterolemia, unspecified: Secondary | ICD-10-CM | POA: Insufficient documentation

## 2013-12-08 DIAGNOSIS — M62830 Muscle spasm of back: Secondary | ICD-10-CM

## 2013-12-08 DIAGNOSIS — Z9889 Other specified postprocedural states: Secondary | ICD-10-CM | POA: Insufficient documentation

## 2013-12-08 DIAGNOSIS — R209 Unspecified disturbances of skin sensation: Secondary | ICD-10-CM | POA: Insufficient documentation

## 2013-12-08 DIAGNOSIS — M5380 Other specified dorsopathies, site unspecified: Secondary | ICD-10-CM | POA: Insufficient documentation

## 2013-12-08 DIAGNOSIS — Z79899 Other long term (current) drug therapy: Secondary | ICD-10-CM | POA: Insufficient documentation

## 2013-12-08 DIAGNOSIS — Z792 Long term (current) use of antibiotics: Secondary | ICD-10-CM | POA: Insufficient documentation

## 2013-12-08 MED ORDER — HYDROCODONE-ACETAMINOPHEN 5-325 MG PO TABS: ORAL_TABLET | ORAL | Status: DC

## 2013-12-08 MED ORDER — IBUPROFEN 800 MG PO TABS
800.0000 mg | ORAL_TABLET | Freq: Three times a day (TID) | ORAL | Status: DC
Start: 2013-12-08 — End: 2014-02-15

## 2013-12-08 MED ORDER — KETOROLAC TROMETHAMINE 30 MG/ML IJ SOLN
30.0000 mg | Freq: Once | INTRAMUSCULAR | Status: AC
  Administered 2013-12-08: 30 mg via INTRAMUSCULAR
  Filled 2013-12-08: qty 1

## 2013-12-08 MED ORDER — HYDROMORPHONE HCL PF 1 MG/ML IJ SOLN
1.0000 mg | Freq: Once | INTRAMUSCULAR | Status: AC
  Administered 2013-12-08: 1 mg via INTRAMUSCULAR
  Filled 2013-12-08: qty 1

## 2013-12-08 MED ORDER — METHOCARBAMOL 500 MG PO TABS: 500.0000 mg | ORAL_TABLET | Freq: Two times a day (BID) | ORAL | Status: DC

## 2013-12-08 NOTE — Discharge Instructions (Signed)
Narcotic and benzodiazepine use may cause drowsiness, slowed breathing or dependence.  Please use with caution and do not drive, operate machinery or watch young children alone while taking them.  Taking combinations of these medications or drinking alcohol will potentiate these effects.    

## 2013-12-08 NOTE — ED Provider Notes (Signed)
CSN: [id number]     Arrival date & time 12/08/13  [id number] History   First MD Initiated Contact with Patient 12/08/13 [phone]     Chief Complaint  Patient presents with  . Back Pain     (Consider location/radiation/quality/duration/timing/severity/associated sxs/prior Treatment) Patient is a 53 y.o. male presenting with back pain. The history is provided by the patient and a relative.  Back Pain Location:  Lumbar spine Quality:  Stiffness and aching Radiates to:  R posterior upper leg Pain severity:  Moderate Pain is:  Same all the time Duration:  3 hours Timing:  Constant Progression:  Unchanged Chronicity:  New Context: twisting   Context: not falling and not jumping from heights   Relieved by:  Nothing Worsened by:  Nothing tried Ineffective treatments:  None tried Associated symptoms: paresthesias   Associated symptoms: no abdominal pain, no bladder incontinence, no bowel incontinence, no fever, no numbness and no weakness     Past Medical History  Diagnosis Date  . Hypertension   . High cholesterol   . H/O cardiac catheterization     "looked fine"   History reviewed. No pertinent past surgical history. History reviewed. No pertinent family history. History  Substance Use Topics  . Smoking status: Never Smoker   . Smokeless tobacco: Not on file  . Alcohol Use: Yes     Comment: occasionally    Review of Systems  Constitutional: Negative for fever.  Gastrointestinal: Negative for abdominal pain and bowel incontinence.  Genitourinary: Negative for bladder incontinence.  Musculoskeletal: Positive for back pain.  Skin: Negative for color change and rash.  Neurological: Positive for paresthesias. Negative for weakness and numbness.      Allergies  Review of patient's allergies indicates no known allergies.  Home Medications   Prior to Admission medications   Medication Sig Start Date End Date Taking? Authorizing Provider  amoxicillin (AMOXIL) 500 MG capsule  Take 1 capsule (500 mg total) by mouth QID. 07/18/12   Hayden Rasmussenavid Mabe, NP  meclizine (ANTIVERT) 25 MG tablet Take 1 tablet (25 mg total) by mouth 3 (three) times daily as needed. 07/30/12   Lisette Paz, PA-C  rosuvastatin (CRESTOR) 20 MG tablet Take 20 mg by mouth daily.      Historical Provider, MD  valsartan-hydrochlorothiazide (DIOVAN-HCT) 320-25 MG per tablet Take 1 tablet by mouth daily.    Historical Provider, MD   BP 120/52  Temp(Src) 97.5 F (36.4 C) (Oral)  Ht 5\' 10"  (1.778 m)  Wt 390 lb (176.903 kg)  BMI 55.96 kg/m2  SpO2 98% Physical Exam  Nursing note and vitals reviewed. Constitutional: He appears well-developed and well-nourished.  HENT:  Head: Normocephalic and atraumatic.  Pulmonary/Chest: Effort normal. No respiratory distress.  Abdominal: He exhibits no distension. There is no tenderness.  Musculoskeletal:       Cervical back: He exhibits no tenderness.       Thoracic back: He exhibits no tenderness.       Lumbar back: He exhibits decreased range of motion, tenderness, pain and spasm. He exhibits no bony tenderness, no deformity and no laceration.  Neurological: He is alert.  Skin: Skin is warm.    ED Course  Procedures (including critical care time) Labs Review Labs Reviewed - No data to display  Imaging Review No results found.   EKG Interpretation None     RA sat is 98% and I interpret to be adequate  MDM   Final diagnoses:  Lumbar paraspinal muscle spasm    Pt with  twisting injury while in bed.  Pt is obese.  Pt is able to walk, no evidence of sig distal neurologic injury or cord symptoms.  NSAIDs, analgesics, muscle relaxants and rest at home.  Discussed heat, ROM exercises and follow up with PCP.      Gavin PoundMichael Y. Oletta LamasGhim, MD 12/08/13 [phone]

## 2013-12-08 NOTE — ED Notes (Signed)
Pt rolled over in bed this morning and states he felt like he pulled something in his lower back. Pt states he had a sharp pain. Pt states after that he had a slight tingling in his right leg. Pt ambulatory, however he states it is painful. Pt states he has had issues with back pain in the past

## 2014-02-15 ENCOUNTER — Emergency Department (INDEPENDENT_AMBULATORY_CARE_PROVIDER_SITE_OTHER)
Admission: EM | Admit: 2014-02-15 | Discharge: 2014-02-15 | Disposition: A | Discharge status: Nursing Home (Includes ICF) | Payer: BC Managed Care – PPO | Source: Home / Self Care | Attending: Emergency Medicine | Admitting: Emergency Medicine

## 2014-02-15 ENCOUNTER — Emergency Department (HOSPITAL_COMMUNITY)
Admission: EM | Admit: 2014-02-15 | Discharge: 2014-02-15 | Disposition: A | Discharge status: Home / Self Care | Payer: BC Managed Care – PPO | Attending: Emergency Medicine | Admitting: Emergency Medicine

## 2014-02-15 ENCOUNTER — Emergency Department (HOSPITAL_COMMUNITY): Payer: BC Managed Care – PPO

## 2014-02-15 ENCOUNTER — Encounter (HOSPITAL_COMMUNITY): Payer: Self-pay | Admitting: Emergency Medicine

## 2014-02-15 DIAGNOSIS — I10 Essential (primary) hypertension: Secondary | ICD-10-CM

## 2014-02-15 DIAGNOSIS — G43009 Migraine without aura, not intractable, without status migrainosus: Secondary | ICD-10-CM

## 2014-02-15 DIAGNOSIS — Z9889 Other specified postprocedural states: Secondary | ICD-10-CM | POA: Insufficient documentation

## 2014-02-15 DIAGNOSIS — E7800 Pure hypercholesterolemia, unspecified: Secondary | ICD-10-CM | POA: Insufficient documentation

## 2014-02-15 DIAGNOSIS — I8390 Asymptomatic varicose veins of unspecified lower extremity: Secondary | ICD-10-CM | POA: Insufficient documentation

## 2014-02-15 DIAGNOSIS — I498 Other specified cardiac arrhythmias: Secondary | ICD-10-CM

## 2014-02-15 DIAGNOSIS — Z79899 Other long term (current) drug therapy: Secondary | ICD-10-CM | POA: Insufficient documentation

## 2014-02-15 DIAGNOSIS — K219 Gastro-esophageal reflux disease without esophagitis: Secondary | ICD-10-CM

## 2014-02-15 DIAGNOSIS — R0789 Other chest pain: Secondary | ICD-10-CM

## 2014-02-15 LAB — CBC WITH DIFFERENTIAL/PLATELET
BASOS ABS: 0 10*3/uL (ref 0.0–0.1)
Basophils Relative: 1 % (ref 0–1)
EOS PCT: 3 % (ref 0–5)
Eosinophils Absolute: 0.2 10*3/uL (ref 0.0–0.7)
HEMATOCRIT: 37.8 % — AB (ref 39.0–52.0)
HEMOGLOBIN: 12.4 g/dL — AB (ref 13.0–17.0)
LYMPHS ABS: 1.4 10*3/uL (ref 0.7–4.0)
LYMPHS PCT: 23 % (ref 12–46)
MCH: 28.1 pg (ref 26.0–34.0)
MCHC: 32.8 g/dL (ref 30.0–36.0)
MCV: 85.7 fL (ref 78.0–100.0)
MONO ABS: 0.3 10*3/uL (ref 0.1–1.0)
MONOS PCT: 5 % (ref 3–12)
NEUTROS ABS: 4.1 10*3/uL (ref 1.7–7.7)
Neutrophils Relative %: 68 % (ref 43–77)
Platelets: 171 10*3/uL (ref 150–400)
RBC: 4.41 MIL/uL (ref 4.22–5.81)
RDW: 15.7 % — ABNORMAL HIGH (ref 11.5–15.5)
WBC: 6 10*3/uL (ref 4.0–10.5)

## 2014-02-15 LAB — COMPREHENSIVE METABOLIC PANEL
ALT: 23 U/L (ref 0–53)
AST: 21 U/L (ref 0–37)
Albumin: 3.4 g/dL — ABNORMAL LOW (ref 3.5–5.2)
Alkaline Phosphatase: 54 U/L (ref 39–117)
Anion gap: 10 (ref 5–15)
BILIRUBIN TOTAL: 0.4 mg/dL (ref 0.3–1.2)
BUN: 13 mg/dL (ref 6–23)
CALCIUM: 8.4 mg/dL (ref 8.4–10.5)
CHLORIDE: 104 meq/L (ref 96–112)
CO2: 28 meq/L (ref 19–32)
CREATININE: 0.83 mg/dL (ref 0.50–1.35)
GLUCOSE: 105 mg/dL — AB (ref 70–99)
Potassium: 3.9 mEq/L (ref 3.7–5.3)
Sodium: 142 mEq/L (ref 137–147)
Total Protein: 6.9 g/dL (ref 6.0–8.3)

## 2014-02-15 LAB — I-STAT TROPONIN, ED: TROPONIN I, POC: 0 ng/mL (ref 0.00–0.08)

## 2014-02-15 MED ORDER — SODIUM CHLORIDE 0.9 % IV SOLN
INTRAVENOUS | Status: DC
  Administered 2014-02-15: 09:00:00 via INTRAVENOUS

## 2014-02-15 MED ORDER — NITROGLYCERIN 0.4 MG SL SUBL
0.4000 mg | SUBLINGUAL_TABLET | SUBLINGUAL | Status: AC | PRN
  Administered 2014-02-15: 0.4 mg via SUBLINGUAL

## 2014-02-15 MED ORDER — GI COCKTAIL ~~LOC~~
30.0000 mL | Freq: Once | ORAL | Status: AC
  Administered 2014-02-15: 30 mL via ORAL
  Filled 2014-02-15: qty 30

## 2014-02-15 MED ORDER — ASPIRIN 81 MG PO CHEW
CHEWABLE_TABLET | ORAL | Status: AC
  Filled 2014-02-15: qty 4

## 2014-02-15 MED ORDER — ASPIRIN 81 MG PO CHEW
324.0000 mg | CHEWABLE_TABLET | Freq: Once | ORAL | Status: AC
  Administered 2014-02-15: 324 mg via ORAL

## 2014-02-15 MED ORDER — NITROGLYCERIN 0.4 MG SL SUBL
SUBLINGUAL_TABLET | SUBLINGUAL | Status: AC
  Filled 2014-02-15: qty 1

## 2014-02-15 NOTE — ED Notes (Signed)
C/o  Severe headache on set around 3 or 4 a.m this morning.  No relief with tylenol.  Having chest tightness that started with headache.  Denies sob, n/v.  Hx hypertension.  No cardiac hx

## 2014-02-15 NOTE — Discharge Instructions (Signed)
We have determined that your problem requires further evaluation in the emergency department.  We will take care of your transport there.  Once at the emergency department, you will be evaluated by a provider and they will order whatever treatment or tests they deem necessary.  We cannot guarantee that they will do any specific test or do any specific treatment.  ° °

## 2014-02-15 NOTE — ED Provider Notes (Signed)
Chief Complaint   Chief Complaint  Patient presents with  . Headache  . Chest Pain    more of tightness than actual pain    History of Present Illness    Anthony Fitzpatrick is a 53 year old male with hypertension and hypercholesterolemia who has had a one hour history of substernal chest tightness. He denies any radiation. He has had no nausea, diaphoresis, shortness of breath, dizziness, or weakness. He denies any history of exertional chest pain in the past. He was hospitalized 4 or 5 years ago with some chest discomfort and had an angiogram which he states was normal. He has not had chest pain like this before and has had no cardiac history. He denies fever, chills, coughing, wheezing, or URI symptoms. He's had no palpitations, presyncope or syncope. He denies any abdominal pain, indigestion, heartburn, nausea, or vomiting. He has had no extremity pain or swelling and denies any pleuritic component to the chest pain. He has no history of diabetes, cigarette smoking, or family history of heart disease.  The patient also states he woke up at around 3 or 4 AM with a bitemporal throbbing headache. This was rated 4-5/10 at the worst and now is down to 2-3/10. The patient states that this is not the worst headache of his life. He denies any fever or stiff neck. He's had no visual or neurological symptoms. The patient states he's had more frequent headaches in the past 2 months which he attributes to stress at work.  Review of Systems    Other than noted above, the patient denies any of the following symptoms. Systemic:  No fever or chills. Pulmonary:  No cough, wheezing, shortness of breath, sputum production, hemoptysis. Cardiac:  No palpitations, rapid heartbeat, dizziness, presyncope or syncope. GI:  No abdominal pain, heartburn, nausea, or vomiting. Ext:  No leg pain or swelling.  PMFSH    Past medical history, family history, social history, meds, and allergies were reviewed. He has  hypertension and hypercholesterolemia. He takes Diovan and Crestor.  Physical Exam     Vital signs:  BP 145/55  Pulse 55  Temp(Src) 97.9 F (36.6 C) (Oral)  Resp 20  SpO2 97% Gen:  Alert, oriented, in no distress, skin warm and dry. Eye:  PERRL, lids and conjunctivas normal.  Sclera non-icteric. ENT:  Mucous membranes moist, pharynx clear. Neck:  Supple, no adenopathy or tenderness.  No JVD. Lungs:  Clear to auscultation, no wheezes, rales or rhonchi.  No respiratory distress. Heart:  Regular rhythm.  No gallops, murmers, clicks or rubs. Chest:  No chest wall tenderness. Abdomen:  Soft, nontender, no organomegaly or mass.  Bowel sounds normal.  No pulsatile abdominal mass or bruit. Ext:  No edema.  No calf tenderness and Homann's sign negative.  Pulses full and equal. Neurological: Alert and oriented x3. Speech was normal. Cranial nerves are intact. There is no pronator drift and finger to nose was normal. Skin:  Warm and dry.  No rash.   EKG Results:  Date: 02/15/2014  Rate: 59  Rhythm: Junctional rhythm  QRS Axis: -16  Intervals: normal  ST/T Wave abnormalities: normal  Conduction Disutrbances:none  Narrative Interpretation: Junctional rhythm, otherwise normal EKG  Old EKG Reviewed: changes noted, junctional rhythm is new and was not present on a previous EKG from 2 years ago and      Course in Urgent Care Center         He was placed on a monitor, nasal O2 at 2 L per  minute via nasal prongs,  begun on IV normal saline at 50 mL per hour and given nitroglycerin 0.40 g sublingually and aspirin 325 mg by mouth.                                                                                                                                                     Assessment     The primary encounter diagnosis was Chest discomfort. Diagnoses of Migraine without aura and without status migrainosus, not intractable, Essential hypertension, Hypercholesterolemia, and Junctional rhythm were  also pertinent to this visit.  Differential diagnosis is acute coronary syndrome, pulmonary embolism, ruptured aneurysm, pneumothorax, Boerhaave syndrome, pericarditis, musculoskeletal pain, or reflux esophagitis.   Plan     The patient was transferred to the ED via [facility] in stable condition.  Medical Decision Making:  53 year old male with HT and hypercholesterolemia has a 1 hour history of substernal chest tightness.  No shortness of breath, nausea, or diaphoresis.  His EKG shows junctional rhythm, otherwise normal.  He has no cardiac history, but did have a cardiac cath 4 to 5  Years ago which he states was normal.  He also has a bitemporal throbbing headache since 3 a.m. This morning.  He is at high risk for CAD, so we will transfer to ED via [facility].  TNG and ASA given.       Reuben Likesavid C Rane Dumm, MD 02/15/14 0930

## 2014-02-15 NOTE — ED Notes (Signed)
Patient here [facility] today [facility], pt reports he woke up with headache this am and then started having chest pain, pt reports dull achy constant pain that subsided after ntg to 1/10, hx of htn and he just wanted to get checked out. Reports only other heart hx is cholesterol .

## 2014-02-15 NOTE — ED Provider Notes (Addendum)
CSN: [id number]     Arrival date & time 02/15/14  1000 History   First MD Initiated Contact with Patient 02/15/14 1008     Chief Complaint  Patient presents with  . Chest Pain  . Headache     (Consider location/radiation/quality/duration/timing/severity/associated sxs/prior Treatment) Patient is a 53 y.o. male presenting with chest pain and headaches. The history is provided by the patient.  Chest Pain Pain location:  Substernal area Pain quality: tightness   Pain radiates to:  Does not radiate Pain radiates to the back: no   Pain severity:  Mild Onset quality:  Gradual Duration:  5 hours Timing:  Constant Progression:  Improving Chronicity:  Recurrent Context: at rest   Context: not eating, no movement and not raising an arm   Context comment:  Started after waking up this morning and getting ready for work Relieved by:  None tried Worsened by:  Nothing tried Ineffective treatments:  Nitroglycerin and aspirin (from urgent care) Associated symptoms: headache   Associated symptoms: no abdominal pain, no anorexia, no cough, no diaphoresis, no dizziness, no fever, no lower extremity edema, no nausea, no near-syncope, no palpitations, no shortness of breath, no syncope, not vomiting and no weakness   Risk factors: high cholesterol, hypertension, male sex and obesity   Risk factors: no coronary artery disease, no diabetes mellitus, no immobilization, no prior DVT/PE, no smoking and no surgery   Headache Pain location:  L temporal and R temporal Quality:  Dull Radiates to:  Does not radiate Severity currently:  1/10 Severity at highest:  4/10 Onset quality:  Gradual Duration:  7 hours Timing:  Constant Progression:  Partially resolved Chronicity:  Recurrent Similar to prior headaches: yes   Relieved by:  Aspirin Worsened by:  Nothing tried Associated symptoms: no abdominal pain, no cough, no dizziness, no fever, no nausea, no near-syncope, no syncope and no vomiting      Past Medical History  Diagnosis Date  . Hypertension   . High cholesterol   . H/O cardiac catheterization     "looked fine"   History reviewed. No pertinent past surgical history. History reviewed. No pertinent family history. History  Substance Use Topics  . Smoking status: Never Smoker   . Smokeless tobacco: Not on file  . Alcohol Use: Yes     Comment: occasionally    Review of Systems  Constitutional: Negative for fever and diaphoresis.  Respiratory: Negative for cough and shortness of breath.   Cardiovascular: Positive for chest pain. Negative for palpitations, syncope and near-syncope.  Gastrointestinal: Negative for nausea, vomiting, abdominal pain and anorexia.  Neurological: Positive for headaches. Negative for dizziness and weakness.  All other systems reviewed and are negative.     Allergies  Review of patient's allergies indicates no known allergies.  Home Medications   Prior to Admission medications   Medication Sig Start Date End Date Taking? Authorizing Provider  rosuvastatin (CRESTOR) 20 MG tablet Take 20 mg by mouth daily.    Yes Historical Provider, MD  valsartan-hydrochlorothiazide (DIOVAN-HCT) 320-25 MG per tablet Take 1 tablet by mouth daily.   Yes Historical Provider, MD   BP 126/58  Pulse 50  Temp(Src) 98 F (36.7 C) (Oral)  Resp 15  Ht 5\' 10"  (1.778 m)  Wt 392 lb (177.81 kg)  BMI 56.25 kg/m2  SpO2 99% Physical Exam  Nursing note and vitals reviewed. Constitutional: He is oriented to person, place, and time. He appears well-developed and well-nourished. No distress.  Morbid obesity  HENT:  Head: Normocephalic and atraumatic.  Mouth/Throat: Oropharynx is clear and moist.  Eyes: Conjunctivae and EOM are normal. Pupils are equal, round, and reactive to light.  Neck: Normal range of motion. Neck supple.  Cardiovascular: Normal rate, regular rhythm and intact distal pulses.   No murmur heard. Pulmonary/Chest: Effort normal and breath  sounds normal. No respiratory distress. He has no wheezes. He has no rales. He exhibits no tenderness.  Abdominal: Soft. He exhibits no distension. There is no tenderness. There is no rebound and no guarding.  Musculoskeletal: Normal range of motion. He exhibits no edema and no tenderness.  Neurological: He is alert and oriented to person, place, and time.  Skin: Skin is warm and dry. No rash noted. No erythema.  Skin changes of chronic venous stasis in the legs bilaterally.  Varicose veins present  Psychiatric: He has a normal mood and affect. His behavior is normal.    ED Course  Procedures (including critical care time) Labs Review Labs Reviewed  CBC WITH DIFFERENTIAL - Abnormal; Notable for the following:    Hemoglobin 12.4 (*)    HCT 37.8 (*)    RDW 15.7 (*)    All other components within normal limits  COMPREHENSIVE METABOLIC PANEL - Abnormal; Notable for the following:    Glucose, Bld 105 (*)    Albumin 3.4 (*)    All other components within normal limits  Rosezena Sensor, ED    Imaging Review Dg Chest Port 1 View  02/15/2014   CLINICAL DATA:  Chest pain.  EXAM: PORTABLE CHEST - 1 VIEW  COMPARISON:  04/20/2011.  FINDINGS: The heart is enlarged. The mediastinal and hilar contours are prominent. There is vascular congestion but no overt pulmonary edema. Low lung volumes with vascular crowding and bibasilar atelectasis. No pleural effusion. The bony thorax is intact.  IMPRESSION: Low lung volumes with vascular crowding and atelectasis.  Cardiac enlargement and vascular congestion without overt pulmonary edema.   Electronically Signed   By: Loralie Champagne M.D.   On: 02/15/2014 11:04     EKG Interpretation   Date/Time:  Monday February 15 2014 10:08:55 EDT Ventricular Rate:  45 PR Interval:    QRS Duration: 97 QT Interval:  455 QTC Calculation: 394 R Axis:   -5 Text Interpretation:  Sinus bradycardia Low voltage, precordial leads  Consider anterior infarct No significant  change since last tracing  Confirmed by Anitra Lauth  MD, Alphonzo Lemmings ([id number]) on 02/15/2014 10:37:02 AM      MDM   Final diagnoses:  Gastroesophageal reflux disease without esophagitis    Pt with atypical story for CP who is low risk.  Dull tightness in the substernal area after waking up at 6am this morning.  States around 3-4am he woke up with a typical HA which he gets often and took 2 asa and laid back down and went to sleep and then when he woke up this morning had the chest pain. Denies any associated sx.  Hx of cardiac cath in 2011 that was normal.  Heart score 3 for risk factors and age. Pt does not use drugs or tobacco.  Low risk well's and pt's story not suggestive of PE.  Pt denies hx of GERD but has been more stressed and working more and took ASA and laid down 2 hours prior to pain starting.  EKG with sinus brady but o/w wnl.   No hx to suggest dissection or infectious process.  CXR, CBC, CMP, trop pending.  Pt was given ASA and NTG  at urgent care without change in sx.  Only 2/10 currently.  Will give GI cocktail.  Secondly patient is complaining of a headache that he broke up with her in 3-4 AM which is a typical headache for him only 4/10 pain.  Not the worst he has had and in the typical location bilateral temples.  Aspirin has improved the headache and it is now a 1/10.  No symptoms concerning for subarachnoid hemorrhage, space occupying lesion, increased intracranial pressure or other pathologic headache.  11:51 AM Labs and imaging without acute findings.  Pt feels great after gi cocktail and requesting to go home.  Feel most likely gERD today from taking asa on empty stomach.  Gwyneth SproutWhitney Jihan Rudy, MD 02/15/14 1201  Gwyneth SproutWhitney Saamiya Jeppsen, MD 02/15/14 1202

## 2014-04-02 ENCOUNTER — Emergency Department (HOSPITAL_COMMUNITY)
Admission: EM | Admit: 2014-04-02 | Discharge: 2014-04-02 | Disposition: A | Discharge status: Home / Self Care | Payer: BC Managed Care – PPO | Source: Home / Self Care | Attending: Emergency Medicine | Admitting: Emergency Medicine

## 2014-04-02 ENCOUNTER — Encounter (HOSPITAL_COMMUNITY): Payer: Self-pay | Admitting: Emergency Medicine

## 2014-04-02 DIAGNOSIS — J069 Acute upper respiratory infection, unspecified: Secondary | ICD-10-CM

## 2014-04-02 DIAGNOSIS — B9789 Other viral agents as the cause of diseases classified elsewhere: Principal | ICD-10-CM

## 2014-04-02 LAB — POCT RAPID STREP A: STREPTOCOCCUS, GROUP A SCREEN (DIRECT): NEGATIVE

## 2014-04-02 MED ORDER — BENZONATATE 100 MG PO CAPS: 100.0000 mg | ORAL_CAPSULE | Freq: Two times a day (BID) | ORAL | Status: DC | PRN

## 2014-04-02 NOTE — ED Notes (Signed)
C/o  Sore throat earlier in the week.  Nasal and chest congestion.  Productive cough with yellow sputum.  Sweats.  Diarrhea.  Sweats/ cold chills   Denies vomiting and fever.  Symptoms present since Tuesday.   Mild relief with otc robitussin.

## 2014-04-02 NOTE — Discharge Instructions (Signed)
You have a cold. You should be over the worst of it by Sunday or Monday. Use the tessalon twice a day as needed for cough. You can also take a teaspoon of honey every hour for the cough. For the congestion, gait Mucinex at the drug store and take it twice a day. Use nasal saline spray as often as you can tolerate. Use a humidifier at night. Make sure you're drinking plenty of fluids.  If you develop fevers, shortness of breath, blood in the sputum, are just not getting better, please return for reevaluation.

## 2014-04-02 NOTE — ED Provider Notes (Signed)
CSN: [id number]     Arrival date & time 04/02/14  [id number] History   First MD Initiated Contact with Patient 04/02/14 1009     Chief Complaint  Patient presents with  . URI  . Sore Throat   (Consider location/radiation/quality/duration/timing/severity/associated sxs/prior Treatment) HPI He is a 53 year old man here today for evaluation of congestion. He states his symptoms started about 5 days ago. He reports nasal congestion and chest congestion. He has a mild cough that is productive of yellowish sputum. He denies shortness of breath. He denies any fevers or chills. He does report some diarrhea that started yesterday. He reports he initially had a sore throat, but this has resolved. He has been taking Robitussin with mild improvement. He has felt run down to the point where he was unable to work yesterday or today. He is a nonsmoker.  Past Medical History  Diagnosis Date  . Hypertension   . High cholesterol   . H/O cardiac catheterization     "looked fine"   History reviewed. No pertinent past surgical history. History reviewed. No pertinent family history. History  Substance Use Topics  . Smoking status: Never Smoker   . Smokeless tobacco: Not on file  . Alcohol Use: Yes     Comment: occasionally    Review of Systems  Constitutional: Positive for fatigue. Negative for fever, chills and appetite change.  HENT: Positive for congestion and rhinorrhea. Negative for sinus pressure, sore throat and trouble swallowing.   Respiratory: Positive for cough. Negative for shortness of breath and wheezing.   Cardiovascular: Negative.   Gastrointestinal: Positive for diarrhea. Negative for vomiting and abdominal pain.  Musculoskeletal: Negative.   Neurological: Negative.     Allergies  Review of patient's allergies indicates no known allergies.  Home Medications   Prior to Admission medications   Medication Sig Start Date End Date Taking? Authorizing Provider  rosuvastatin (CRESTOR) 20  MG tablet Take 20 mg by mouth daily.    Yes Historical Provider, MD  valsartan-hydrochlorothiazide (DIOVAN-HCT) 320-25 MG per tablet Take 1 tablet by mouth daily.   Yes Historical Provider, MD  benzonatate (TESSALON) 100 MG capsule Take 1 capsule (100 mg total) by mouth 2 (two) times daily as needed for cough. 04/02/14   Charm RingsErin J Ardie Mclennan, MD   BP 101/65  Pulse 51  Temp(Src) 97.8 F (36.6 C) (Oral)  Resp 16  SpO2 97% Physical Exam  Constitutional: He is oriented to person, place, and time. He appears well-developed and well-nourished. No distress.  HENT:  Head: Normocephalic and atraumatic.  Right Ear: Tympanic membrane and external ear normal.  Left Ear: Tympanic membrane and external ear normal.  Nose: Mucosal edema and rhinorrhea present. Right sinus exhibits no maxillary sinus tenderness and no frontal sinus tenderness. Left sinus exhibits no maxillary sinus tenderness and no frontal sinus tenderness.  Mouth/Throat: Oropharynx is clear and moist and mucous membranes are normal. No oropharyngeal exudate, posterior oropharyngeal edema or posterior oropharyngeal erythema.  Eyes: Conjunctivae are normal. Right eye exhibits no discharge. Left eye exhibits no discharge.  Neck: Normal range of motion. Neck supple.  Cardiovascular: Normal rate, regular rhythm and normal heart sounds.  Exam reveals no gallop.   No murmur heard. Pulmonary/Chest: Effort normal and breath sounds normal. No respiratory distress. He has no wheezes.  Lymphadenopathy:    He has no cervical adenopathy.  Neurological: He is alert and oriented to person, place, and time.  Skin: Skin is warm and dry. No rash noted.    ED  Course  Procedures (including critical care time) Labs Review Labs Reviewed  POCT RAPID STREP A ([facility] CARE ONLY)    Imaging Review No results found.   MDM   1. Viral URI with cough    Rapid strep negative. No signs or symptoms of pneumonia. Discussed symptomatic treatment with Tessalon and  honey for cough. Also recommended nasal saline spray, Mucinex, humidifier for congestion. Reviewed warning signs to return as in after visit summary. Followup if not improving early next week.    Charm Rings, MD 04/02/14 1100

## 2014-04-04 LAB — CULTURE, GROUP A STREP

## 2014-05-11 ENCOUNTER — Encounter (HOSPITAL_COMMUNITY): Payer: Self-pay | Admitting: Emergency Medicine

## 2014-05-11 ENCOUNTER — Emergency Department (HOSPITAL_COMMUNITY)
Admission: EM | Admit: 2014-05-11 | Discharge: 2014-05-11 | Disposition: A | Discharge status: Home / Self Care | Payer: BC Managed Care – PPO | Attending: Emergency Medicine | Admitting: Emergency Medicine

## 2014-05-11 DIAGNOSIS — M545 Low back pain, unspecified: Secondary | ICD-10-CM | POA: Insufficient documentation

## 2014-05-11 DIAGNOSIS — I10 Essential (primary) hypertension: Secondary | ICD-10-CM | POA: Insufficient documentation

## 2014-05-11 DIAGNOSIS — Z79899 Other long term (current) drug therapy: Secondary | ICD-10-CM | POA: Diagnosis not present

## 2014-05-11 DIAGNOSIS — E785 Hyperlipidemia, unspecified: Secondary | ICD-10-CM | POA: Diagnosis not present

## 2014-05-11 DIAGNOSIS — E669 Obesity, unspecified: Secondary | ICD-10-CM | POA: Insufficient documentation

## 2014-05-11 DIAGNOSIS — Z9889 Other specified postprocedural states: Secondary | ICD-10-CM | POA: Insufficient documentation

## 2014-05-11 MED ORDER — HYDROCODONE-ACETAMINOPHEN 5-325 MG PO TABS: 1.0000 | ORAL_TABLET | ORAL | Status: DC | PRN

## 2014-05-11 MED ORDER — IBUPROFEN 800 MG PO TABS: 800.0000 mg | ORAL_TABLET | Freq: Three times a day (TID) | ORAL | Status: DC

## 2014-05-11 NOTE — Discharge Instructions (Signed)
Call for a follow up appointment with a Family or Primary Care Provider.  °Return if Symptoms worsen.   °Take medication as prescribed.  °Do not operate heavy machinery or drink alcohol while taking narcotic pain medication. °

## 2014-05-11 NOTE — ED Provider Notes (Signed)
CSN: [id number]     Arrival date & time 05/11/14  1106 History  This chart was scribed for non-physician practitioner Mellody Drown, PA-C working with Ethelda Chick, MD by Littie Deeds, ED Scribe. This patient was seen in room TR07C/TR07C and the patient's care was started at 1:44 PM.     Chief Complaint  Patient presents with  . Back Pain      HPI Comments: Anthony Fitzpatrick is a 53 y.o. male who presents to the Emergency Department complaining of gradual onset, constant back pain that began 3 days ago. Denies radiation to lower tremor days. He has been taking Tylenol for his pain. He has not seen his PCP for his back pain. The pain is worsened with movement. Patient reports similar symptoms with previous episodes of low back pain. Patient denies injury or lifting anything heavy. 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   PCP Dr. Renae Gloss at [facility]  The history is provided by the patient. No language interpreter was used.    Past Medical History  Diagnosis Date  . Hypertension   . High cholesterol   . H/O cardiac catheterization     "looked fine"   History reviewed. No pertinent past surgical history. History reviewed. No pertinent family history. History  Substance Use Topics  . Smoking status: Never Smoker   . Smokeless tobacco: Not on file  . Alcohol Use: Yes     Comment: occasionally    Review of Systems  Constitutional: Negative for fever and chills.  Genitourinary: Negative for urgency and difficulty urinating.  Musculoskeletal: Positive for back pain. Negative for gait problem.  Neurological: Negative for weakness and numbness.  All other systems reviewed and are negative.     Allergies  Review of patient's allergies indicates no known allergies.  Home Medications   Prior to Admission medications   Medication Sig Start Date End Date Taking? Authorizing Provider  rosuvastatin (CRESTOR) 20 MG tablet Take 20 mg by mouth daily.    Yes  Historical Provider, MD  valsartan-hydrochlorothiazide (DIOVAN-HCT) 320-25 MG per tablet Take 1 tablet by mouth daily.   Yes Historical Provider, MD   BP 149/75  Pulse 63  Temp(Src) 97.4 F (36.3 C) (Oral)  Resp 18  SpO2 95% Physical Exam  Nursing note and vitals reviewed. Constitutional: He is oriented to person, place, and time. He appears well-developed and well-nourished. No distress.  Obese male  HENT:  Head: Normocephalic and atraumatic.  Mouth/Throat: Oropharynx is clear and moist.  Eyes: Pupils are equal, round, and reactive to light.  Neck: Neck supple.  Pulmonary/Chest: Effort normal. No respiratory distress.  Musculoskeletal:       Back:  Normal sensation to touch, normal strength to bilateral lower extremities.  Neurological: He is alert and oriented to person, place, and time.  Skin: Skin is warm and dry.  Psychiatric: He has a normal mood and affect. His behavior is normal.    ED Course  Procedures  1:49 PM-Discussed treatment plan which includes Ibuprofen with pt at bedside and pt agreed to plan.   Labs Review Labs Reviewed - No data to display  Imaging Review No results found.   EKG Interpretation None      MDM   Final diagnoses:  Low back pain, unspecified back pain laterality, with sciatica presence unspecified   Patient with back pain.  No neurological deficits and normal neuro exam.  Patient can walk but states is painful.  No loss of  bowel or bladder control.  No concern for cauda equina.  No fever, night sweats, weight loss, h/o cancer, IVDU.  RICE protocol and pain medicine indicated and discussed with patient.   Meds given in ED:  Medications - No data to display  Discharge Medication List as of 05/11/2014  2:30 PM    START taking these medications   Details  HYDROcodone-acetaminophen (NORCO/VICODIN) 5-325 MG per tablet Take 1 tablet by mouth every 4 (four) hours as needed for moderate pain or severe pain., Starting 05/11/2014, Until  Discontinued, Print    ibuprofen (ADVIL,MOTRIN) 800 MG tablet Take 1 tablet (800 mg total) by mouth 3 (three) times daily., Starting 05/11/2014, Until Discontinued, Print       I personally performed the services described in this documentation, which was scribed in my presence. The recorded information has been reviewed and is accurate.    Mellody DrownLauren Adekunle Rohrbach, PA-C 05/11/14 1740

## 2014-05-11 NOTE — ED Notes (Signed)
Declined W/C at D/C and was escorted to lobby by RN. 

## 2014-05-11 NOTE — ED Notes (Signed)
Pt has chronic LBP "on and off". Today, c/o pain in lower back. No known injury.

## 2014-05-11 NOTE — ED Notes (Signed)
Pt c/o lower back pain x 3 days; pt sts same in past and denies new injury

## 2014-05-12 NOTE — ED Provider Notes (Signed)
Medical screening examination/treatment/procedure(s) were performed by non-physician practitioner and as supervising physician I was immediately available for consultation/collaboration.   EKG Interpretation None       Martha K Linker, MD 05/12/14 2238 

## 2014-08-14 ENCOUNTER — Encounter (HOSPITAL_COMMUNITY): Payer: Self-pay | Admitting: Emergency Medicine

## 2014-08-14 ENCOUNTER — Emergency Department (INDEPENDENT_AMBULATORY_CARE_PROVIDER_SITE_OTHER)
Admission: EM | Admit: 2014-08-14 | Discharge: 2014-08-14 | Disposition: A | Discharge status: Home / Self Care | Payer: BC Managed Care – PPO | Source: Home / Self Care | Attending: Family Medicine | Admitting: Family Medicine

## 2014-08-14 DIAGNOSIS — J069 Acute upper respiratory infection, unspecified: Secondary | ICD-10-CM

## 2014-08-14 NOTE — ED Provider Notes (Signed)
Anthony Fitzpatrick is a 54 y.o. male who presents to Urgent Care today for viral URI. Patient was sick all last week with cough congestion and runny nose. He missed work last week and feels better and would like to return to work now. No vomiting or diarrhea. He feels well otherwise.   Past Medical History  Diagnosis Date  . Hypertension   . High cholesterol   . H/O cardiac catheterization     "looked fine"   History reviewed. No pertinent past surgical history. History  Substance Use Topics  . Smoking status: Never Smoker   . Smokeless tobacco: Not on file  . Alcohol Use: Yes     Comment: occasionally   ROS as above Medications: No current facility-administered medications for this encounter.   Current Outpatient Prescriptions  Medication Sig Dispense Refill  . ibuprofen (ADVIL,MOTRIN) 800 MG tablet Take 1 tablet (800 mg total) by mouth 3 (three) times daily. 21 tablet 0  . valsartan-hydrochlorothiazide (DIOVAN-HCT) 320-25 MG per tablet Take 1 tablet by mouth daily.    Marland Kitchen HYDROcodone-acetaminophen (NORCO/VICODIN) 5-325 MG per tablet Take 1 tablet by mouth every 4 (four) hours as needed for moderate pain or severe pain. 10 tablet 0  . rosuvastatin (CRESTOR) 20 MG tablet Take 20 mg by mouth daily.      No Known Allergies   Exam:  BP 156/58 mmHg  Pulse 62  Temp(Src) 98.3 F (36.8 C) (Oral)  Resp 18  SpO2 97% Gen: Well NAD morbidly obese HEENT: EOMI,  MMM normal posterior pharynx. Scarred tympanic membranes without erythema or effusion bilaterally. Lungs: Normal work of breathing. CTABL Heart: RRR no MRG Abd: NABS, Soft. Nondistended, Nontender Exts: Brisk capillary refill, warm and well perfused.   No results found for this or any previous visit (from the past 24 hour(s)). No results found.  Assessment and Plan: 54 y.o. male with resolved URI. Return to work. Follow-up with PCP regarding elevated blood pressure. Work note provided.  Discussed warning signs or symptoms.  Please see discharge instructions. Patient expresses understanding.     Rodolph Bong, MD 08/14/14 [id number]

## 2014-08-14 NOTE — Discharge Instructions (Signed)
Thank you for coming in today. Return as needed.   Call or go to the emergency room if you get worse, have trouble breathing, have chest pains, or palpitations.    Upper Respiratory Infection, Adult An upper respiratory infection (URI) is also sometimes known as the common cold. The upper respiratory tract includes the nose, sinuses, throat, trachea, and bronchi. Bronchi are the airways leading to the lungs. Most people improve within 1 week, but symptoms can last up to 2 weeks. A residual cough may last even longer.  CAUSES Many different viruses can infect the tissues lining the upper respiratory tract. The tissues become irritated and inflamed and often become very moist. Mucus production is also common. A cold is contagious. You can easily spread the virus to others by oral contact. This includes kissing, sharing a glass, coughing, or sneezing. Touching your mouth or nose and then touching a surface, which is then touched by another person, can also spread the virus. SYMPTOMS  Symptoms typically develop 1 to 3 days after you come in contact with a cold virus. Symptoms vary from person to person. They may include:  Runny nose.  Sneezing.  Nasal congestion.  Sinus irritation.  Sore throat.  Loss of voice (laryngitis).  Cough.  Fatigue.  Muscle aches.  Loss of appetite.  Headache.  Low-grade fever. DIAGNOSIS  You might diagnose your own cold based on familiar symptoms, since most people get a cold 2 to 3 times a year. Your caregiver can confirm this based on your exam. Most importantly, your caregiver can check that your symptoms are not due to another disease such as strep throat, sinusitis, pneumonia, asthma, or epiglottitis. Blood tests, throat tests, and X-rays are not necessary to diagnose a common cold, but they may sometimes be helpful in excluding other more serious diseases. Your caregiver will decide if any further tests are required. RISKS AND COMPLICATIONS  You may  be at risk for a more severe case of the common cold if you smoke cigarettes, have chronic heart disease (such as heart failure) or lung disease (such as asthma), or if you have a weakened immune system. The very young and very old are also at risk for more serious infections. Bacterial sinusitis, middle ear infections, and bacterial pneumonia can complicate the common cold. The common cold can worsen asthma and chronic obstructive pulmonary disease (COPD). Sometimes, these complications can require emergency medical care and may be life-threatening. PREVENTION  The best way to protect against getting a cold is to practice good hygiene. Avoid oral or hand contact with people with cold symptoms. Wash your hands often if contact occurs. There is no clear evidence that vitamin C, vitamin E, echinacea, or exercise reduces the chance of developing a cold. However, it is always recommended to get plenty of rest and practice good nutrition. TREATMENT  Treatment is directed at relieving symptoms. There is no cure. Antibiotics are not effective, because the infection is caused by a virus, not by bacteria. Treatment may include:  Increased fluid intake. Sports drinks offer valuable electrolytes, sugars, and fluids.  Breathing heated mist or steam (vaporizer or shower).  Eating chicken soup or other clear broths, and maintaining good nutrition.  Getting plenty of rest.  Using gargles or lozenges for comfort.  Controlling fevers with ibuprofen or acetaminophen as directed by your caregiver.  Increasing usage of your inhaler if you have asthma. Zinc gel and zinc lozenges, taken in the first 24 hours of the common cold, can shorten the  duration and lessen the severity of symptoms. Pain medicines may help with fever, muscle aches, and throat pain. A variety of non-prescription medicines are available to treat congestion and runny nose. Your caregiver can make recommendations and may suggest nasal or lung  inhalers for other symptoms.  HOME CARE INSTRUCTIONS   Only take over-the-counter or prescription medicines for pain, discomfort, or fever as directed by your caregiver.  Use a warm mist humidifier or inhale steam from a shower to increase air moisture. This may keep secretions moist and make it easier to breathe.  Drink enough water and fluids to keep your urine clear or pale yellow.  Rest as needed.  Return to work when your temperature has returned to normal or as your caregiver advises. You may need to stay home longer to avoid infecting others. You can also use a face mask and careful hand washing to prevent spread of the virus. SEEK MEDICAL CARE IF:   After the first few days, you feel you are getting worse rather than better.  You need your caregiver's advice about medicines to control symptoms.  You develop chills, worsening shortness of breath, or brown or red sputum. These may be signs of pneumonia.  You develop yellow or brown nasal discharge or pain in the face, especially when you bend forward. These may be signs of sinusitis.  You develop a fever, swollen neck glands, pain with swallowing, or white areas in the back of your throat. These may be signs of strep throat. SEEK IMMEDIATE MEDICAL CARE IF:   You have a fever.  You develop severe or persistent headache, ear pain, sinus pain, or chest pain.  You develop wheezing, a prolonged cough, cough up blood, or have a change in your usual mucus (if you have chronic lung disease).  You develop sore muscles or a stiff neck. Document Released: 01/23/2001 Document Revised: 10/22/2011 Document Reviewed: 11/04/2013 [facility] Patient Information 2015 [place], [place]. This information is not intended to replace advice given to you by your health care provider. Make sure you discuss any questions you have with your health care provider.

## 2014-08-14 NOTE — ED Notes (Signed)
Pt reports he needs a note from Dr. Jodie Echevaria him to go back to work. Reports he has missed work x1 week for cold sx Getting better but still has some congestion Alert, no signs of acute disterss.

## 2014-12-17 ENCOUNTER — Encounter (HOSPITAL_COMMUNITY): Payer: Self-pay | Admitting: Emergency Medicine

## 2014-12-17 ENCOUNTER — Emergency Department (HOSPITAL_COMMUNITY)
Admission: EM | Admit: 2014-12-17 | Discharge: 2014-12-17 | Disposition: A | Discharge status: Home / Self Care | Payer: BLUE CROSS/BLUE SHIELD | Attending: Emergency Medicine | Admitting: Emergency Medicine

## 2014-12-17 ENCOUNTER — Emergency Department (HOSPITAL_COMMUNITY): Payer: BLUE CROSS/BLUE SHIELD

## 2014-12-17 DIAGNOSIS — I10 Essential (primary) hypertension: Secondary | ICD-10-CM | POA: Diagnosis not present

## 2014-12-17 DIAGNOSIS — Z79899 Other long term (current) drug therapy: Secondary | ICD-10-CM | POA: Diagnosis not present

## 2014-12-17 DIAGNOSIS — E780 Pure hypercholesterolemia: Secondary | ICD-10-CM | POA: Insufficient documentation

## 2014-12-17 DIAGNOSIS — Z9889 Other specified postprocedural states: Secondary | ICD-10-CM | POA: Diagnosis not present

## 2014-12-17 DIAGNOSIS — Z791 Long term (current) use of non-steroidal anti-inflammatories (NSAID): Secondary | ICD-10-CM | POA: Diagnosis not present

## 2014-12-17 DIAGNOSIS — N201 Calculus of ureter: Secondary | ICD-10-CM | POA: Diagnosis not present

## 2014-12-17 DIAGNOSIS — K8020 Calculus of gallbladder without cholecystitis without obstruction: Secondary | ICD-10-CM | POA: Diagnosis not present

## 2014-12-17 DIAGNOSIS — M549 Dorsalgia, unspecified: Secondary | ICD-10-CM | POA: Diagnosis present

## 2014-12-17 LAB — COMPREHENSIVE METABOLIC PANEL
ALBUMIN: 3.6 g/dL (ref 3.5–5.0)
ALT: 25 U/L (ref 17–63)
AST: 25 U/L (ref 15–41)
Alkaline Phosphatase: 57 U/L (ref 38–126)
Anion gap: 9 (ref 5–15)
BUN: 15 mg/dL (ref 6–20)
CALCIUM: 8.6 mg/dL — AB (ref 8.9–10.3)
CO2: 25 mmol/L (ref 22–32)
Chloride: 104 mmol/L (ref 101–111)
Creatinine, Ser: 0.95 mg/dL (ref 0.61–1.24)
GFR calc Af Amer: >60 mL/min (ref >60)
GFR calc non Af Amer: >60 mL/min (ref >60)
Glucose, Bld: 148 mg/dL — ABNORMAL HIGH (ref 70–99)
Potassium: 3.3 mmol/L — ABNORMAL LOW (ref 3.5–5.1)
Sodium: 138 mmol/L (ref 135–145)
TOTAL PROTEIN: 6.8 g/dL (ref 6.5–8.1)
Total Bilirubin: 1 mg/dL (ref 0.3–1.2)

## 2014-12-17 LAB — CBC WITH DIFFERENTIAL/PLATELET
BASOS ABS: 0 10*3/uL (ref 0.0–0.1)
BASOS PCT: 0 % (ref 0–1)
Eosinophils Absolute: 0.2 10*3/uL (ref 0.0–0.7)
Eosinophils Relative: 3 % (ref 0–5)
HCT: 38.5 % — ABNORMAL LOW (ref 39.0–52.0)
Hemoglobin: 13.1 g/dL (ref 13.0–17.0)
LYMPHS PCT: 26 % (ref 12–46)
Lymphs Abs: 1.7 10*3/uL (ref 0.7–4.0)
MCH: 28.6 pg (ref 26.0–34.0)
MCHC: 34 g/dL (ref 30.0–36.0)
MCV: 84.1 fL (ref 78.0–100.0)
MONOS PCT: 6 % (ref 3–12)
Monocytes Absolute: 0.4 10*3/uL (ref 0.1–1.0)
NEUTROS ABS: 4.2 10*3/uL (ref 1.7–7.7)
NEUTROS PCT: 65 % (ref 43–77)
Platelets: 198 10*3/uL (ref 150–400)
RBC: 4.58 MIL/uL (ref 4.22–5.81)
RDW: 15.7 % — ABNORMAL HIGH (ref 11.5–15.5)
WBC: 6.4 10*3/uL (ref 4.0–10.5)

## 2014-12-17 LAB — LIPASE, BLOOD: LIPASE: 81 U/L — AB (ref 22–51)

## 2014-12-17 MED ORDER — ONDANSETRON 8 MG PO TBDP: 8.0000 mg | ORAL_TABLET | Freq: Three times a day (TID) | ORAL | Status: DC | PRN

## 2014-12-17 MED ORDER — KETOROLAC TROMETHAMINE 30 MG/ML IJ SOLN
30.0000 mg | Freq: Once | INTRAMUSCULAR | Status: AC
  Administered 2014-12-17: 30 mg via INTRAVENOUS
  Filled 2014-12-17: qty 1

## 2014-12-17 MED ORDER — ONDANSETRON HCL 4 MG/2ML IJ SOLN: 4.0000 mg | Freq: Four times a day (QID) | INTRAMUSCULAR | Status: DC | PRN

## 2014-12-17 MED ORDER — TAMSULOSIN HCL 0.4 MG PO CAPS: 0.4000 mg | ORAL_CAPSULE | Freq: Every day | ORAL | Status: DC

## 2014-12-17 MED ORDER — OXYCODONE-ACETAMINOPHEN 5-325 MG PO TABS: 1.0000 | ORAL_TABLET | Freq: Four times a day (QID) | ORAL | Status: DC | PRN

## 2014-12-17 MED ORDER — SODIUM CHLORIDE 0.9 % IV BOLUS (SEPSIS)
1000.0000 mL | Freq: Once | INTRAVENOUS | Status: AC
Start: 2014-12-17 — End: 2014-12-17
  Administered 2014-12-17: 1000 mL via INTRAVENOUS

## 2014-12-17 MED ORDER — HYDROCODONE-ACETAMINOPHEN 5-325 MG PO TABS
2.0000 | ORAL_TABLET | Freq: Once | ORAL | Status: AC
  Administered 2014-12-17: 2 via ORAL
  Filled 2014-12-17: qty 2

## 2014-12-17 MED ORDER — HYDROMORPHONE HCL 1 MG/ML IJ SOLN
1.0000 mg | Freq: Once | INTRAMUSCULAR | Status: AC
  Administered 2014-12-17: 1 mg via INTRAVENOUS
  Filled 2014-12-17: qty 1

## 2014-12-17 NOTE — ED Provider Notes (Signed)
CSN: [id number]     Arrival date & time 12/17/14  0519 History   First MD Initiated Contact with Patient 12/17/14 0522     Chief Complaint  Patient presents with  . Back Pain     (Consider location/radiation/quality/duration/timing/severity/associated sxs/prior Treatment) HPI  54 year old male presents with acute severe back pain started when he woke up. The pain did not wake him up but he notices it as he woke up at his typical time of 4:15 in the morning. Patient states the pain is more thoracic and lumbar. He typically has lumbar back pain and has for a while but this is different. There is no radiation of the pain. The pain is just right of midline. Urinary symptoms. Patient is starting to feel some pain on his right flank as well. No nausea or vomiting. No pain, numbness, or weakness in his legs. No bowel or bladder incontinence. No saddle anesthesia. Has notany fevers or chills. Denies any trauma. 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 Has not taken anything for the pain.  Past Medical History  Diagnosis Date  . Hypertension   . High cholesterol   . H/O cardiac catheterization     "looked fine"   History reviewed. No pertinent past surgical history. No family history on file. History  Substance Use Topics  . Smoking status: Never Smoker   . Smokeless tobacco: Not on file  . Alcohol Use: Yes     Comment: occasionally    Review of Systems  Constitutional: Negative for fever.  Gastrointestinal: Negative for nausea, vomiting and abdominal pain.  Genitourinary: Positive for flank pain. Negative for dysuria and hematuria.  Musculoskeletal: Positive for back pain.  Neurological: Negative for weakness and numbness.      Allergies  Review of patient's allergies indicates no known allergies.  Home Medications   Prior to Admission medications   Medication Sig Start Date End Date Taking? Authorizing Provider  HYDROcodone-acetaminophen  (NORCO/VICODIN) 5-325 MG per tablet Take 1 tablet by mouth every 4 (four) hours as needed for moderate pain or severe pain. 05/11/14   Mellody DrownLauren Parker, PA-C  ibuprofen (ADVIL,MOTRIN) 800 MG tablet Take 1 tablet (800 mg total) by mouth 3 (three) times daily. 05/11/14   Mellody DrownLauren Parker, PA-C  rosuvastatin (CRESTOR) 20 MG tablet Take 20 mg by mouth daily.     Historical Provider, MD  valsartan-hydrochlorothiazide (DIOVAN-HCT) 320-25 MG per tablet Take 1 tablet by mouth daily.    Historical Provider, MD   BP 132/60 mmHg  Pulse 50  Temp(Src) 97.8 F (36.6 C) (Oral)  Resp 18  Ht 5\' 10"  (1.778 m)  Wt 380 lb (172.367 kg)  BMI 54.52 kg/m2  SpO2 99% Physical Exam  Constitutional: He is oriented to person, place, and time. He appears well-developed and well-nourished.  Morbidly obese  HENT:  Head: Normocephalic and atraumatic.  Right Ear: External ear normal.  Left Ear: External ear normal.  Nose: Nose normal.  Eyes: Right eye exhibits no discharge. Left eye exhibits no discharge.  Neck: Neck supple.  Cardiovascular: Normal rate, regular rhythm, normal heart sounds and intact distal pulses.   Pulmonary/Chest: Effort normal and breath sounds normal.  Abdominal: Soft. There is no tenderness. There is no CVA tenderness.  Musculoskeletal: He exhibits no edema.       Thoracic back: He exhibits no tenderness.       Lumbar back: He exhibits no tenderness.  Neurological: He is alert and oriented to person, place, and  time.  Normal strength and sensation in lower extremities. Unable to get DTRs due to obesity  Skin: Skin is warm and dry.  Nursing note and vitals reviewed.   ED Course  Procedures (including critical care time) Labs Review Labs Reviewed  COMPREHENSIVE METABOLIC PANEL - Abnormal; Notable for the following:    Potassium 3.3 (*)    Glucose, Bld 148 (*)    Calcium 8.6 (*)    All other components within normal limits  LIPASE, BLOOD - Abnormal; Notable for the following:    Lipase 81 (*)     All other components within normal limits  CBC WITH DIFFERENTIAL/PLATELET - Abnormal; Notable for the following:    HCT 38.5 (*)    RDW 15.7 (*)    All other components within normal limits  URINALYSIS, ROUTINE W REFLEX MICROSCOPIC    Imaging Review Ct Renal Stone Study  12/17/2014   CLINICAL DATA:  Awakened at 4:15 with back pain.  Worsening.  EXAM: CT ABDOMEN AND PELVIS WITHOUT CONTRAST  TECHNIQUE: Multidetector CT imaging of the abdomen and pelvis was performed following the standard protocol without IV contrast.  COMPARISON:  None.  FINDINGS: There is a 2 mm calculus at the right ureterovesical junction with mild hydroureter and hydronephrosis. No other urinary calculi are evident.  There is an 11 mm calculus in the gallbladder. There is no bile duct dilatation. There are normal unenhanced appearances of the liver, spleen, pancreas and adrenals. Mesentery and bowel appear unremarkable. There is a large fat containing umbilical hernia. There is no significant abnormality in the lower chest. No significant skeletal lesion is evident. There is moderately severe degenerative disc disease at L4-5 and moderate facet arthritis at L5-S1 bilaterally.  IMPRESSION: *2 mm obstructing calculus at the right UVJ with mild hydroureter and hydronephrosis *Cholelithiasis *Large fat containing umbilical hernia *Degenerative disc and facet changes in the lower lumbar spine and lumbosacral junction.   Electronically Signed   By: Ellery Plunkaniel R Mitchell M.D.   On: 12/17/2014 06:29     EKG Interpretation None      MDM   Final diagnoses:  Right ureteral stone  Calculus of gallbladder without cholecystitis without obstruction    Patient's pain appears to be coming from a right UVJ calculus.  No fevers or signs of sepsis. Aorta is of normal caliber an AAA is very unlikely. Does have a large cholelithiasis but no right upper quadrant pain on repeated exams, no fever, no vomiting.  Patient now complaining this pain is  in his right lower quadrant , seems most consistent with the ureteral calculus. Will refer to urology, but this should be passable given is only 2 mm.  Currently urine is pending, care transferred to Dr. Rhunette CroftNanavati with plan to d/c assuming urine is not infected. F/u with urology if still having pain.    Pricilla LovelessScott Tajee Savant, MD 12/17/14 [phone]

## 2014-12-17 NOTE — ED Notes (Signed)
Patient transported to CT 

## 2014-12-17 NOTE — ED Notes (Addendum)
  Patient states he woke up this am at 0415 with tremendous back pain that is getting worse, hx of chronic back pain but states it has never felt like this.  No difficulty with bowel or bladder control, no radiation down legs.  States pain is 10/10, does not change with position.

## 2014-12-17 NOTE — ED Notes (Signed)
Pt given urine strainer and cup to take home.

## 2014-12-17 NOTE — ED Notes (Signed)
Pt. Unable to void at the moment. 

## 2014-12-17 NOTE — ED Notes (Signed)
Informed patient urine sample is still needed. Urinal at the bedside

## 2014-12-17 NOTE — ED Notes (Addendum)
Pt called out stating that he has a new pain in his right lower quadrant that radiates into his right testicle.

## 2014-12-17 NOTE — ED Notes (Addendum)
  Prompted patient for urine, patient states he cannot go at the moment.

## 2014-12-17 NOTE — ED Notes (Signed)
Pt still unable to give urine sample

## 2015-02-11 HISTORY — PX: ABDOMINAL HERNIA REPAIR: SHX539

## 2015-02-11 HISTORY — PX: LAPAROSCOPIC CHOLECYSTECTOMY: SUR755

## 2015-06-23 ENCOUNTER — Other Ambulatory Visit: Payer: Self-pay | Admitting: Internal Medicine

## 2015-06-23 ENCOUNTER — Other Ambulatory Visit: Payer: Self-pay | Admitting: Nurse Practitioner

## 2015-06-23 DIAGNOSIS — L0390 Cellulitis, unspecified: Secondary | ICD-10-CM

## 2015-07-11 ENCOUNTER — Ambulatory Visit
Admission: RE | Admit: 2015-07-11 | Discharge: 2015-07-11 | Disposition: A | Discharge status: Home / Self Care | Payer: BLUE CROSS/BLUE SHIELD | Source: Ambulatory Visit | Attending: Internal Medicine | Admitting: Internal Medicine

## 2015-07-11 DIAGNOSIS — L0390 Cellulitis, unspecified: Secondary | ICD-10-CM

## 2016-01-04 ENCOUNTER — Emergency Department (HOSPITAL_COMMUNITY): Payer: Managed Care, Other (non HMO)

## 2016-01-04 ENCOUNTER — Encounter (HOSPITAL_COMMUNITY): Payer: Self-pay | Admitting: Emergency Medicine

## 2016-01-04 DIAGNOSIS — M47814 Spondylosis without myelopathy or radiculopathy, thoracic region: Secondary | ICD-10-CM | POA: Diagnosis not present

## 2016-01-04 DIAGNOSIS — Z6841 Body Mass Index (BMI) 40.0 and over, adult: Secondary | ICD-10-CM | POA: Insufficient documentation

## 2016-01-04 DIAGNOSIS — E7800 Pure hypercholesterolemia, unspecified: Secondary | ICD-10-CM | POA: Insufficient documentation

## 2016-01-04 DIAGNOSIS — I209 Angina pectoris, unspecified: Secondary | ICD-10-CM | POA: Diagnosis present

## 2016-01-04 DIAGNOSIS — Z7982 Long term (current) use of aspirin: Secondary | ICD-10-CM | POA: Diagnosis not present

## 2016-01-04 DIAGNOSIS — I10 Essential (primary) hypertension: Secondary | ICD-10-CM | POA: Insufficient documentation

## 2016-01-04 DIAGNOSIS — R079 Chest pain, unspecified: Principal | ICD-10-CM | POA: Insufficient documentation

## 2016-01-04 LAB — CBC
HCT: 40.7 % (ref 39.0–52.0)
Hemoglobin: 13.4 g/dL (ref 13.0–17.0)
MCH: 27.6 pg (ref 26.0–34.0)
MCHC: 32.9 g/dL (ref 30.0–36.0)
MCV: 83.7 fL (ref 78.0–100.0)
Platelets: 218 K/uL (ref 150–400)
RBC: 4.86 MIL/uL (ref 4.22–5.81)
RDW: 15.3 % (ref 11.5–15.5)
WBC: 7.8 K/uL (ref 4.0–10.5)

## 2016-01-04 LAB — BASIC METABOLIC PANEL WITH GFR
Anion gap: 7 (ref 5–15)
BUN: 18 mg/dL (ref 6–20)
CO2: 26 mmol/L (ref 22–32)
Calcium: 8.9 mg/dL (ref 8.9–10.3)
Chloride: 104 mmol/L (ref 101–111)
Creatinine, Ser: 0.93 mg/dL (ref 0.61–1.24)
GFR calc Af Amer: >60 mL/min
GFR calc non Af Amer: >60 mL/min
Glucose, Bld: 117 mg/dL — ABNORMAL HIGH (ref 65–99)
Potassium: 3.6 mmol/L (ref 3.5–5.1)
Sodium: 137 mmol/L (ref 135–145)

## 2016-01-04 LAB — I-STAT TROPONIN, ED: Troponin i, poc: 0.01 ng/mL (ref 0.00–0.08)

## 2016-01-04 NOTE — ED Notes (Signed)
C/o sharp pain to center of chest since 10pm.  Reports not feeling well all day.  Denies sob, nausea, and vomiting.

## 2016-01-05 ENCOUNTER — Encounter (HOSPITAL_COMMUNITY): Payer: Self-pay | Admitting: Internal Medicine

## 2016-01-05 ENCOUNTER — Observation Stay (HOSPITAL_COMMUNITY)
Admission: EM | Admit: 2016-01-05 | Discharge: 2016-01-06 | Disposition: A | Discharge status: Home / Self Care | Payer: Managed Care, Other (non HMO) | Attending: Internal Medicine | Admitting: Internal Medicine

## 2016-01-05 DIAGNOSIS — I10 Essential (primary) hypertension: Secondary | ICD-10-CM | POA: Diagnosis present

## 2016-01-05 DIAGNOSIS — I209 Angina pectoris, unspecified: Secondary | ICD-10-CM | POA: Diagnosis not present

## 2016-01-05 DIAGNOSIS — R079 Chest pain, unspecified: Secondary | ICD-10-CM

## 2016-01-05 DIAGNOSIS — E785 Hyperlipidemia, unspecified: Secondary | ICD-10-CM | POA: Diagnosis present

## 2016-01-05 DIAGNOSIS — IMO0001 Reserved for inherently not codable concepts without codable children: Secondary | ICD-10-CM

## 2016-01-05 DIAGNOSIS — Z6841 Body Mass Index (BMI) 40.0 and over, adult: Secondary | ICD-10-CM

## 2016-01-05 DIAGNOSIS — Z0389 Encounter for observation for other suspected diseases and conditions ruled out: Secondary | ICD-10-CM

## 2016-01-05 DIAGNOSIS — R0789 Other chest pain: Secondary | ICD-10-CM | POA: Diagnosis not present

## 2016-01-05 HISTORY — DX: Calculus of kidney: N20.0

## 2016-01-05 HISTORY — DX: Obstructive sleep apnea (adult) (pediatric): G47.33

## 2016-01-05 HISTORY — DX: Body Mass Index (BMI) 40.0 and over, adult: Z684

## 2016-01-05 HISTORY — DX: Morbid (severe) obesity due to excess calories: E66.01

## 2016-01-05 HISTORY — DX: Chest pain, unspecified: R07.9

## 2016-01-05 HISTORY — DX: Obstructive sleep apnea (adult) (pediatric): Z99.89

## 2016-01-05 LAB — D-DIMER, QUANTITATIVE (NOT AT ARMC)

## 2016-01-05 LAB — LIPID PANEL
CHOL/HDL RATIO: 4.3 ratio
CHOLESTEROL: 102 mg/dL (ref 0–200)
CHOLESTEROL: 115 mg/dL (ref 0–200)
HDL: 24 mg/dL — ABNORMAL LOW (ref >40)
HDL: 27 mg/dL — AB (ref >40)
LDL Cholesterol: 57 mg/dL (ref 0–99)
LDL Cholesterol: 59 mg/dL (ref 0–99)
TRIGLYCERIDES: 106 mg/dL (ref <150)
TRIGLYCERIDES: 144 mg/dL (ref <150)
Total CHOL/HDL Ratio: 4.3 RATIO
VLDL: 21 mg/dL (ref 0–40)
VLDL: 29 mg/dL (ref 0–40)

## 2016-01-05 LAB — TROPONIN I
Troponin I: <0.03 ng/mL (ref <0.031)
Troponin I: <0.03 ng/mL (ref <0.031)
Troponin I: <0.03 ng/mL (ref <0.031)
Troponin I: <0.03 ng/mL (ref <0.031)

## 2016-01-05 LAB — BRAIN NATRIURETIC PEPTIDE: B Natriuretic Peptide: 12.7 pg/mL (ref 0.0–100.0)

## 2016-01-05 MED ORDER — ENOXAPARIN SODIUM 40 MG/0.4ML ~~LOC~~ SOLN
40.0000 mg | Freq: Every day | SUBCUTANEOUS | Status: DC
  Administered 2016-01-05: 40 mg via SUBCUTANEOUS
  Filled 2016-01-05 (×2): qty 0.4

## 2016-01-05 MED ORDER — MORPHINE SULFATE (PF) 2 MG/ML IV SOLN: 2.0000 mg | INTRAVENOUS | Status: DC | PRN

## 2016-01-05 MED ORDER — GI COCKTAIL ~~LOC~~: 30.0000 mL | Freq: Four times a day (QID) | ORAL | Status: DC | PRN

## 2016-01-05 MED ORDER — ROSUVASTATIN CALCIUM 10 MG PO TABS
20.0000 mg | ORAL_TABLET | Freq: Every day | ORAL | Status: DC
  Administered 2016-01-05 – 2016-01-06 (×2): 20 mg via ORAL
  Filled 2016-01-05 (×2): qty 2

## 2016-01-05 MED ORDER — ASPIRIN 325 MG PO TABS
325.0000 mg | ORAL_TABLET | Freq: Once | ORAL | Status: AC
  Administered 2016-01-05: 325 mg via ORAL
  Filled 2016-01-05: qty 1

## 2016-01-05 MED ORDER — ONDANSETRON HCL 4 MG/2ML IJ SOLN: 4.0000 mg | Freq: Four times a day (QID) | INTRAMUSCULAR | Status: DC | PRN

## 2016-01-05 MED ORDER — IRBESARTAN 300 MG PO TABS
300.0000 mg | ORAL_TABLET | Freq: Every day | ORAL | Status: DC
  Administered 2016-01-05 – 2016-01-06 (×2): 300 mg via ORAL
  Filled 2016-01-05 (×2): qty 1

## 2016-01-05 MED ORDER — ACETAMINOPHEN 325 MG PO TABS: 650.0000 mg | ORAL_TABLET | ORAL | Status: DC | PRN

## 2016-01-05 MED ORDER — NITROGLYCERIN 0.4 MG SL SUBL
0.4000 mg | SUBLINGUAL_TABLET | SUBLINGUAL | Status: DC | PRN
  Administered 2016-01-05: 0.4 mg via SUBLINGUAL

## 2016-01-05 MED ORDER — VALSARTAN-HYDROCHLOROTHIAZIDE 320-25 MG PO TABS: 1.0000 | ORAL_TABLET | Freq: Every day | ORAL | Status: DC

## 2016-01-05 MED ORDER — OXYCODONE-ACETAMINOPHEN 5-325 MG PO TABS: 1.0000 | ORAL_TABLET | Freq: Four times a day (QID) | ORAL | Status: DC | PRN

## 2016-01-05 MED ORDER — HYDROCHLOROTHIAZIDE 25 MG PO TABS
25.0000 mg | ORAL_TABLET | Freq: Every day | ORAL | Status: DC
  Administered 2016-01-05 – 2016-01-06 (×2): 25 mg via ORAL
  Filled 2016-01-05 (×2): qty 1

## 2016-01-05 MED ORDER — METOPROLOL TARTRATE 12.5 MG HALF TABLET
12.5000 mg | ORAL_TABLET | Freq: Two times a day (BID) | ORAL | Status: DC
  Administered 2016-01-05 – 2016-01-06 (×3): 12.5 mg via ORAL
  Filled 2016-01-05 (×2): qty 1

## 2016-01-05 NOTE — Progress Notes (Deleted)
Note deleted

## 2016-01-05 NOTE — Progress Notes (Addendum)
RT NOTE:  CPAP auto setup at Bedside, FFM, humidity chamber filled. Pt wears at home and aware how to operate machine. No O2 needed at this time. RT will monitor. Pt will call if assistance needed.

## 2016-01-05 NOTE — H&P (Signed)
History and Physical    Anthony Fitzpatrick ZOX:[id number] DOB: 27-Dec-1960 DOA: 01/05/2016  Referring MD/NP/PA: Dr. Mora Bellman PCP: No primary care provider on file.  Patient coming from: Home  Chief Complaint: Chest pain  HPI: Anthony Fitzpatrick is a 55 y.o. male with medical history significant of morbid obesity, HTN, and HLD; who presents with complaints of chest pain. Symptoms started around 10 PM after patient awoke from sleeping in his chair. He stood up to go to bed and had acute onset of sharp substernal chest pain that did not radiate. He describes it like someone punching him in his chest. Pain was noted to be a 5-6 out of 10 on the pain scale. Denies any significant shortness of breath, nausea, abdominal pain, palpitations, diaphoresis, lightheadedness, or loss of consciousness. He tried walking around without improvement of symptoms. Staying still makes symptoms a dull ache. Symptoms appear to be worsened with deep breaths in and exertion.  He has been in his normal state of health otherwise. Upon review of records patient's last echocardiogram was in 01/2010 and showed EF of 60-65% with normal systolic and diastolic function. He notes that he underwent a cardiac catheterization a couple years ago for which he was told there were no blockages and he did not require any stents. Patient notes that he did travel to [place] at the beginning of this month, but reports taking frequent stops. Denies having any calf pain or leg swelling. Upon EMS arrival patient was given 4 aspirin and a nitroglycerin with some improvement of symptoms.   ED Course: Upon admission into the emergency department patient was seen to be afebrile with vital signs within normal limits. Initial lab work was unremarkable including troponin. Chest x-ray showed no acute abnormalities except for borderline cardiomegaly. Initial EKG showing normal sinus rhythm.  Review of Systems: As per HPI otherwise 10 point review of systems negative.   Past  Medical History  Diagnosis Date  . Hypertension   . High cholesterol   . H/O cardiac catheterization     "looked fine"    History reviewed. No pertinent past surgical history.   reports that he has never smoked. He does not have any smokeless tobacco history on file. He reports that he drinks alcohol. He reports that he does not use illicit drugs.  No Known Allergies   Family History  Problem Relation Age of Onset  . Hypertension      Prior to Admission medications   Medication Sig Start Date End Date Taking? Authorizing Provider  naproxen sodium (ANAPROX) 220 MG tablet Take 220 mg by mouth 2 (two) times daily as needed (for pain).   Yes Historical Provider, MD  ondansetron (ZOFRAN ODT) 8 MG disintegrating tablet Take 1 tablet (8 mg total) by mouth every 8 (eight) hours as needed for nausea. 12/17/14  Yes Derwood Kaplan, MD  oxyCODONE-acetaminophen (PERCOCET) 5-325 MG per tablet Take 1-2 tablets by mouth every 6 (six) hours as needed. Patient taking differently: Take 1-2 tablets by mouth every 6 (six) hours as needed for moderate pain.  12/17/14  Yes Pricilla Loveless, MD  rosuvastatin (CRESTOR) 20 MG tablet Take 20 mg by mouth daily.    Yes Historical Provider, MD  valsartan-hydrochlorothiazide (DIOVAN-HCT) 320-25 MG per tablet Take 1 tablet by mouth daily.   Yes Historical Provider, MD    Physical Exam: Filed Vitals:   01/04/16 2239 01/05/16 0053  BP: 139/67 122/53  Pulse: 62   Temp: 98.1 F (36.7 C)   TempSrc: Oral  Resp: 20   SpO2: 95%       Constitutional: Morbidly obese male in NAD, calm, comfortable Filed Vitals:   01/04/16 2239 01/05/16 0053  BP: 139/67 122/53  Pulse: 62   Temp: 98.1 F (36.7 C)   TempSrc: Oral   Resp: 20   SpO2: 95%    Eyes: PERRL, lids and conjunctivae normal ENMT: Mucous membranes are moist. Posterior pharynx clear of any exudate or lesions.Normal dentition.  Neck: normal, supple, no masses, no thyromegaly Respiratory: clear to  auscultation bilaterally, no wheezing, no crackles. Normal respiratory effort. No accessory muscle use.  Cardiovascular: Regular rate and rhythm, no murmurs / rubs / gallops. No extremity edema. 2+ pedal pulses. No carotid bruits.  Abdomen: no tenderness, no masses palpated. No hepatosplenomegaly. Bowel sounds positive.  Musculoskeletal: no clubbing / cyanosis. No joint deformity upper and lower extremities. Good ROM, no contractures. Normal muscle tone.  Skin: no rashes, lesions, ulcers. No induration Neurologic: CN 2-12 grossly intact. Sensation intact, DTR normal. Strength 5/5 in all 4.  Psychiatric: Normal judgment and insight. Alert and oriented x 3. Normal mood.     Labs on Admission: I have personally reviewed following labs and imaging studies  CBC:  Recent Labs Lab 01/04/16 2242  WBC 7.8  HGB 13.4  HCT 40.7  MCV 83.7  PLT 218   Basic Metabolic Panel:  Recent Labs Lab 01/04/16 2242  NA 137  K 3.6  CL 104  CO2 26  GLUCOSE 117*  BUN 18  CREATININE 0.93  CALCIUM 8.9   GFR: CrCl cannot be calculated (Unknown ideal weight.). Liver Function Tests: No results for input(s): AST, ALT, ALKPHOS, BILITOT, PROT, ALBUMIN in the last 168 hours. No results for input(s): LIPASE, AMYLASE in the last 168 hours. No results for input(s): AMMONIA in the last 168 hours. Coagulation Profile: No results for input(s): INR, PROTIME in the last 168 hours. Cardiac Enzymes: No results for input(s): CKTOTAL, CKMB, CKMBINDEX, TROPONINI in the last 168 hours. BNP (last 3 results) No results for input(s): PROBNP in the last 8760 hours. HbA1C: No results for input(s): HGBA1C in the last 72 hours. CBG: No results for input(s): GLUCAP in the last 168 hours. Lipid Profile: No results for input(s): CHOL, HDL, LDLCALC, TRIG, CHOLHDL, LDLDIRECT in the last 72 hours. Thyroid Function Tests: No results for input(s): TSH, T4TOTAL, FREET4, T3FREE, THYROIDAB in the last 72 hours. Anemia Panel: No  results for input(s): VITAMINB12, FOLATE, FERRITIN, TIBC, IRON, RETICCTPCT in the last 72 hours. Urine analysis:    Component Value Date/Time   COLORURINE YELLOW 06/11/2009 1205   APPEARANCEUR CLEAR 06/11/2009 1205   LABSPEC 1.017 06/11/2009 1205   PHURINE 6.5 06/11/2009 1205   GLUCOSEU NEGATIVE 06/11/2009 1205   HGBUR NEGATIVE 06/11/2009 1205   BILIRUBINUR NEGATIVE 06/11/2009 1205   KETONESUR NEGATIVE 06/11/2009 1205   PROTEINUR NEGATIVE 06/11/2009 1205   UROBILINOGEN 0.2 06/11/2009 1205   NITRITE NEGATIVE 06/11/2009 1205   LEUKOCYTESUR  06/11/2009 1205    NEGATIVE MICROSCOPIC NOT DONE ON URINES WITH NEGATIVE PROTEIN, BLOOD, LEUKOCYTES, NITRITE, OR GLUCOSE <1000 mg/dL.   Sepsis Labs: No results found for this or any previous visit (from the past 240 hour(s)).   Radiological Exams on Admission: Dg Chest 2 View  01/04/2016  CLINICAL DATA:  Chest pain 1 hour.  Hypertension. EXAM: CHEST  2 VIEW COMPARISON:  02/15/2014 FINDINGS: Lungs are adequately inflated and otherwise clear. Borderline cardiomegaly unchanged. Mild spondylosis of the thoracic spine. IMPRESSION: No active cardiopulmonary disease. Electronically Signed  By: Elberta Fortis M.D.   On: 01/04/2016 23:25    EKG: Independently reviewed. Normal sinus rhythm  Assessment/Plan Chest pain: Acute. Onset upon rising from the patient's chair reporting sharp nonradiating substernal chest pain. Risk factors include obesity, hypertension, hyperlipidemia. Heart score = 4 - 5 - Admit to telemetry bed - trend troponins, check d-dimer - Repeat EKG in a.m. - Check an echocardiogram  - Morphine/nitroglycerin prn pain - GI cocktail prn - Message sent to Salley Hews to put patient on the list to be evaluated by cardiology - Follow-up telemetry   Essential hypertension - Continue Diovan  Hyperlipidemia - check lipid panel - continue crestor  Morbid obesity - Will need further counseling on risk modification  DVT prophylaxis:  lovenox Code Status: Full Family Communication: None  Disposition Plan:Undetermined  Consults called: none Admission status: Telemetry observation  Clydie Braun MD [facility] Pager [phone]  If 7PM-7AM, please contact night-coverage [email or web address] Password [email or web address]  01/05/2016, 1:00 AM

## 2016-01-05 NOTE — ED Provider Notes (Signed)
CSN: [id number]     Arrival date & time 01/04/16  2232 History  By signing my name below, I, Anthony Fitzpatrick, attest that this documentation has been prepared under the direction and in the presence of Anthony CrumbleAdeleke Hazael Olveda, MD. Electronically Signed: Bethel BornBritney Fitzpatrick, ED Scribe. 01/05/2016. 12:42 AM    Chief Complaint  Patient presents with  . Chest Pain   The history is provided by the patient. No language interpreter was used.   Anthony Fitzpatrick is a 55 y.o. male with PMHx of HTN and high cholesterol  who presents to the Emergency Department complaining of new, constant, non-radiating, 6/10 in severity, central chest pain with onset approximately 2.5 hours ago. He describes the pain as "like someone is  Punching me".  Pt denies SOB, nausea, vomiting, sweating. He has had no recent cough, rhinorrhea, or fever. No history of DVT/PE. He recently drove [place] hours to [place] but denies any LE pain or swelling. He took an aspirin this morning.   Past Medical History  Diagnosis Date  . Hypertension   . High cholesterol   . H/O cardiac catheterization     "looked fine"   History reviewed. No pertinent past surgical history. No family history on file. Social History  Substance Use Topics  . Smoking status: Never Smoker   . Smokeless tobacco: None  . Alcohol Use: Yes     Comment: occasionally    Review of Systems  10 Systems reviewed and all are negative for acute change except as noted in the HPI.  Allergies  Review of patient's allergies indicates no known allergies.  Home Medications   Prior to Admission medications   Medication Sig Start Date End Date Taking? Authorizing Provider  naproxen sodium (ANAPROX) 220 MG tablet Take 220 mg by mouth 2 (two) times daily as needed (for pain).   Yes Historical Provider, MD  ondansetron (ZOFRAN ODT) 8 MG disintegrating tablet Take 1 tablet (8 mg total) by mouth every 8 (eight) hours as needed for nausea. 12/17/14  Yes Derwood KaplanAnkit Nanavati, MD   oxyCODONE-acetaminophen (PERCOCET) 5-325 MG per tablet Take 1-2 tablets by mouth every 6 (six) hours as needed. Patient taking differently: Take 1-2 tablets by mouth every 6 (six) hours as needed for moderate pain.  12/17/14  Yes Pricilla LovelessScott Goldston, MD  rosuvastatin (CRESTOR) 20 MG tablet Take 20 mg by mouth daily.    Yes Historical Provider, MD  valsartan-hydrochlorothiazide (DIOVAN-HCT) 320-25 MG per tablet Take 1 tablet by mouth daily.   Yes Historical Provider, MD   BP 139/67 mmHg  Pulse 62  Temp(Src) 98.1 F (36.7 C) (Oral)  Resp 20  SpO2 95% Physical Exam  Constitutional: He is oriented to person, place, and time. Vital signs are normal. He appears well-developed and well-nourished.  Non-toxic appearance. He does not appear ill. No distress.  Obese male   HENT:  Head: Normocephalic and atraumatic.  Nose: Nose normal.  Mouth/Throat: Oropharynx is clear and moist. No oropharyngeal exudate.  Eyes: Conjunctivae and EOM are normal. Pupils are equal, round, and reactive to light. No scleral icterus.  Neck: Normal range of motion. Neck supple. No tracheal deviation, no edema, no erythema and normal range of motion present. No thyroid mass and no thyromegaly present.  Cardiovascular: Normal rate, regular rhythm, S1 normal, S2 normal, normal heart sounds, intact distal pulses and normal pulses.  Exam reveals no gallop and no friction rub.   No murmur heard. Pulmonary/Chest: Effort normal and breath sounds normal. No respiratory distress. He has no wheezes.  He has no rhonchi. He has no rales.  Abdominal: Soft. Normal appearance and bowel sounds are normal. He exhibits no distension, no ascites and no mass. There is no hepatosplenomegaly. There is no tenderness. There is no rebound, no guarding and no CVA tenderness.  Musculoskeletal: Normal range of motion. He exhibits edema. He exhibits no tenderness.  BLE edema and venous stasis changes   Lymphadenopathy:    He has no cervical adenopathy.   Neurological: He is alert and oriented to person, place, and time. He has normal strength. No cranial nerve deficit or sensory deficit.  Skin: Skin is warm, dry and intact. No petechiae and no rash noted. He is not diaphoretic. No erythema. No pallor.  Psychiatric: He has a normal mood and affect. His behavior is normal. Judgment normal.  Nursing note and vitals reviewed.   ED Course  Procedures (including critical care time) DIAGNOSTIC STUDIES: Oxygen Saturation is 95% on RA,  normal by my interpretation.    COORDINATION OF CARE: 12:33 AM Discussed treatment plan which includes lab work, CXR, EKG, aspirin, and NTG with pt at bedside and pt agreed to plan.  1:07 AM-Consult complete with Dr. Katrinka Blazing (Hospitalist). Patient case explained and discussed. Agrees to admit patient for further evaluation and treatment. Pt to be admitted to the [facility] unit. Call ended at 1:07 AM  Labs Review Labs Reviewed  BASIC METABOLIC PANEL - Abnormal; Notable for the following:    Glucose, Bld 117 (*)    All other components within normal limits  CBC  I-STAT TROPOININ, ED    Imaging Review Dg Chest 2 View  01/04/2016  CLINICAL DATA:  Chest pain 1 hour.  Hypertension. EXAM: CHEST  2 VIEW COMPARISON:  02/15/2014 FINDINGS: Lungs are adequately inflated and otherwise clear. Borderline cardiomegaly unchanged. Mild spondylosis of the thoracic spine. IMPRESSION: No active cardiopulmonary disease. Electronically Signed   By: Elberta Fortis M.D.   On: 01/04/2016 23:25   I have personally reviewed and evaluated these images and lab results as part of my medical decision-making.   EKG Interpretation   Date/Time:  Wednesday Jan 04 2016 22:39:43 EDT Ventricular Rate:  62 PR Interval:    QRS Duration: 106 QT Interval:  438 QTC Calculation: 444 R Axis:   -19 Text Interpretation:  Normal sinus rhythm Interpretation limited secondary  to artifact No significant change since last tracing Confirmed by Erroll Luna [phone]) on 01/05/2016 12:52:51 AM      MDM   Final diagnoses:  None     Patient presents tot he ED for chest pain that is concerning for ACS.  Troponin and EKG are unremarkable.  No history of stents.  He was given aspirin and nitro for chest pain.  Will admit for further work up.   Patient given nitro and pain is now just an ache.  Patient is stable for tele for further care.       I personally performed the services described in this documentation, which was scribed in my presence. The recorded information has been reviewed and is accurate.     Anthony Crumble, MD 01/05/16 [phone]

## 2016-01-05 NOTE — Progress Notes (Signed)
Patient ambulates 300 ft. States initial sharp pain when standing from chair. As continued to walk he stated pain became a dull pain. No SOB. No dizziness. Patient tolerated well. Patient back to chair. Will continue to monitor.  Anthony HoarLexie Jamin Panther RN

## 2016-01-05 NOTE — Consult Note (Addendum)
Reason for Consult:   Chest pain  Requesting Physician: Dr Izola Price Primary Cardiologist Dr Leonia Reeves saw in 2011  HPI:   55 y/o morbidly obese male, divorced, lives alone, non smoker. He works for [employer]. Medical problems include HTN, HLD, and sleep apnea (on C-pap). He was seen by cardiology in June 2011 for chest pain. Cath then showed normal coronaries and normal LVF. We have not seen him since and he has not had further problems with chest pain till last PM. Yesterday evening he says he had fallen asleep in his chair. He got up around 10 pm and when he stood up he had sudden epigastric discomfort-"like I had been punched". He denies any associated nausea, vomiting, diaphoresis, or SOB. He denies any radiation to his back, arms, or jaw. He thinks it was worse with deep breathing. His symptoms persisted and he called EMS. Upon EMS arrival patient was given 4 aspirin and a nitroglycerin with some improvement of symptoms. He says his chest pain would wax and wane but he has not had any this am. His Troponin are negative and his EKG shows no acute changes.    PMHx:  Past Medical History  Diagnosis Date  . Hypertension   . High cholesterol   . H/O cardiac catheterization June 2011    "looked fine"  . Morbid obesity (HCC)     BMI 55    Past Surgical History  Procedure Laterality Date  . Cholecystectomy, laparoscopic  July 2016  . Abdominal hernia repair  July 2016    SOCHx:  reports that he has never smoked. He does not have any smokeless tobacco history on file. He reports that he drinks alcohol. He reports that he does not use illicit drugs.  FAMHx: Family History  Problem Relation Age of Onset  . Hypertension      ALLERGIES: No Known Allergies  ROS: Review of Systems: General: negative for chills, fever, night sweats or weight changes.  Cardiovascular: negative for dyspnea on exertion, edema, orthopnea, palpitations, paroxysmal nocturnal dyspnea or shortness of  breath HEENT: negative for any visual disturbances, blindness, glaucoma Dermatological: negative for rash Respiratory: negative for cough, hemoptysis, or wheezing Urologic: negative for hematuria or dysuria Abdominal: negative for nausea, vomiting, diarrhea, bright red blood per rectum, melena, or hematemesis Neurologic: negative for visual changes, syncope, or dizziness Musculoskeletal: negative for back pain, joint pain, or swelling Psych: cooperative and appropriate All other systems reviewed and are otherwise negative except as noted above.   HOME MEDICATIONS: Prior to Admission medications   Medication Sig Start Date End Date Taking? Authorizing Provider  naproxen sodium (ANAPROX) 220 MG tablet Take 220 mg by mouth 2 (two) times daily as needed (for pain).   Yes Historical Provider, MD  ondansetron (ZOFRAN ODT) 8 MG disintegrating tablet Take 1 tablet (8 mg total) by mouth every 8 (eight) hours as needed for nausea. 12/17/14  Yes Derwood Kaplan, MD  oxyCODONE-acetaminophen (PERCOCET) 5-325 MG per tablet Take 1-2 tablets by mouth every 6 (six) hours as needed. Patient taking differently: Take 1-2 tablets by mouth every 6 (six) hours as needed for moderate pain.  12/17/14  Yes Pricilla Loveless, MD  rosuvastatin (CRESTOR) 20 MG tablet Take 20 mg by mouth daily.    Yes Historical Provider, MD  valsartan-hydrochlorothiazide (DIOVAN-HCT) 320-25 MG per tablet Take 1 tablet by mouth daily.   Yes Historical Provider, MD    HOSPITAL MEDICATIONS: I have reviewed the patient's current medications.  VITALS: Blood pressure 127/53, pulse 53, temperature 98 F (36.7 C), temperature source Oral, resp. rate 16, height 5\' 11"  (1.803 m), weight 393 lb 4.8 oz (178.4 kg), SpO2 95 %.  PHYSICAL EXAM: General appearance: alert, cooperative, no distress and morbidly obese Neck: no carotid bruit and no JVD Lungs: clear to auscultation bilaterally Heart: regular rate and rhythm Abdomen: obese, non  tender Extremities: venous stasis dermatitis noted and 1+ edema, chronic skin changes Pulses: 2+ and symmetric Skin: cool and dry Neurologic: Grossly normal  LABS: Results for orders placed or performed during the hospital encounter of 01/05/16 (from the past 24 hour(s))  Basic metabolic panel     Status: Abnormal   Collection Time: 01/04/16 10:42 PM  Result Value Ref Range   Sodium 137 135 - 145 mmol/L   Potassium 3.6 3.5 - 5.1 mmol/L   Chloride 104 101 - 111 mmol/L   CO2 26 22 - 32 mmol/L   Glucose, Bld 117 (H) 65 - 99 mg/dL   BUN 18 6 - 20 mg/dL   Creatinine, Ser 4.090.93 0.61 - 1.24 mg/dL   Calcium 8.9 8.9 - 81.110.3 mg/dL   GFR calc non Af Amer >60 >60 mL/min   GFR calc Af Amer >60 >60 mL/min   Anion gap 7 5 - 15  CBC     Status: None   Collection Time: 01/04/16 10:42 PM  Result Value Ref Range   WBC 7.8 4.0 - 10.5 K/uL   RBC 4.86 4.22 - 5.81 MIL/uL   Hemoglobin 13.4 13.0 - 17.0 g/dL   HCT 91.440.7 78.239.0 - 95.652.0 %   MCV 83.7 78.0 - 100.0 fL   MCH 27.6 26.0 - 34.0 pg   MCHC 32.9 30.0 - 36.0 g/dL   RDW 21.315.3 08.611.5 - 57.815.5 %   Platelets 218 150 - 400 K/uL  I-stat troponin, ED     Status: None   Collection Time: 01/04/16 10:47 PM  Result Value Ref Range   Troponin i, poc 0.01 0.00 - 0.08 ng/mL   Comment 3          Troponin I-serum (0, 3, 6 hours)     Status: None   Collection Time: 01/05/16  2:31 AM  Result Value Ref Range   Troponin I <0.03 <0.031 ng/mL  Troponin I-serum (0, 3, 6 hours)     Status: None   Collection Time: 01/05/16  4:03 AM  Result Value Ref Range   Troponin I <0.03 <0.031 ng/mL  Lipid panel     Status: Abnormal   Collection Time: 01/05/16  4:03 AM  Result Value Ref Range   Cholesterol 102 0 - 200 mg/dL   Triglycerides 469106 <629<150 mg/dL   HDL 24 (L) >52>40 mg/dL   Total CHOL/HDL Ratio 4.3 RATIO   VLDL 21 0 - 40 mg/dL   LDL Cholesterol 57 0 - 99 mg/dL  D-dimer, quantitative (not at [facility])     Status: None   Collection Time: 01/05/16  4:03 AM  Result Value Ref Range    D-Dimer, Quant <0.27 0.00 - 0.50 ug/mL-FEU    EKG: NSR  IMAGING: Dg Chest 2 View  01/04/2016  CLINICAL DATA:  Chest pain 1 hour.  Hypertension. EXAM: CHEST  2 VIEW COMPARISON:  02/15/2014 FINDINGS: Lungs are adequately inflated and otherwise clear. Borderline cardiomegaly unchanged. Mild spondylosis of the thoracic spine. IMPRESSION: No active cardiopulmonary disease. Electronically Signed   By: Elberta Fortisaniel  Boyle M.D.   On: 01/04/2016 23:25    IMPRESSION: Principal Problem:  Chest pain Active Problems:   Essential hypertension   Hyperlipidemia   Morbid obesity (HCC)   Normal coronary arteries-2011   RECOMMENDATION: Not convinced his pain is cardiac in nature. Not sure a Myoview or stress echo would be helpful secondary to morbid obesity. Will review with MD.  Add PPI. (he has already had a full breakfast this am).   Time Spent Directly with Patient: 40 minutes  Corine Shelter, [place]  [phone] beeper 01/05/2016, 7:56 AM  The patient has been seen in conjunction with Corine Shelter, PA-C. All aspects of care have been considered and discussed. The patient has been personally interviewed, examined, and all clinical data has been reviewed.   The chest pain has atypical features. It occurred while bending over to get up from a seated position. The discomfort as continued nearly continuously since that time. Cardiac markers and EKGs did not reveal evidence of injury or ischemia.  Significant cardiac risk factors are age, obesity, obstructive sleep apnea, hypertension, and hyperlipidemia.  He had "normal coronary arteries" in 2011.  Exam is unremarkable with the exception of significant obesity and chronic stasis changes in the lower extremities. I do not hear a pericardial rub or murmur. There is no palpable sternal tenderness. No skin rashes noted.  Chest discomfort, concerning in this gentleman with multiple risk factors. He has not had myocardial infarction and 6 years ago had widely  patent coronary arteries. My inclination is conservative management. Perfusion imaging will be fraught with a high false positivity rate due to obesity. We don't have strong enough clinical data to recommend invasive evaluation with attendant risk of complications. With prior normal coronary angio, I believe musculoskeletal or gastroesophageal explanations for the discomfort are more plausible  Therefore, I recommend aggressive risk factor modification with tight blood pressure control, LDL cholesterol of less than 100, and use of sublingual nitroglycerin if recurrent chest discomfort. If symptoms continue to plague the patient, coronary angiography will have to be repeated.

## 2016-01-05 NOTE — Progress Notes (Signed)
Pt seen and examined at bedside, admitted after midnight, please see earlier admission note by Dr. Katrinka BlazingSmith. Pt admitted for evaluation of CP. Cardiology consulted, ? Need for stress test. Follow up on cardiology team recommendations. If no plan for stress test inpatient, can d/c home.  Debbora PrestoMAGICK-MYERS, ISKRA, MD  [facility] Pager [phone]  If 7PM-7AM, please contact night-coverage [email or web address] Password [email or web address]

## 2016-01-06 ENCOUNTER — Encounter (HOSPITAL_COMMUNITY)
Admission: EM | Disposition: A | Discharge status: Home / Self Care | Payer: Self-pay | Source: Home / Self Care | Attending: Emergency Medicine

## 2016-01-06 DIAGNOSIS — R0789 Other chest pain: Secondary | ICD-10-CM | POA: Diagnosis not present

## 2016-01-06 DIAGNOSIS — I10 Essential (primary) hypertension: Secondary | ICD-10-CM | POA: Diagnosis not present

## 2016-01-06 LAB — TROPONIN I: Troponin I: <0.03 ng/mL (ref <0.031)

## 2016-01-06 SURGERY — LEFT HEART CATH AND CORONARY ANGIOGRAPHY

## 2016-01-06 MED ORDER — SODIUM CHLORIDE 0.9 % WEIGHT BASED INFUSION: 3.0000 mL/kg/h | INTRAVENOUS | Status: DC

## 2016-01-06 MED ORDER — SODIUM CHLORIDE 0.9% FLUSH: 3.0000 mL | Freq: Two times a day (BID) | INTRAVENOUS | Status: DC

## 2016-01-06 MED ORDER — SODIUM CHLORIDE 0.9 % IV SOLN
250.0000 mL | INTRAVENOUS | Status: DC | PRN
Start: 2016-01-06 — End: 2016-01-06

## 2016-01-06 MED ORDER — SODIUM CHLORIDE 0.9% FLUSH: 3.0000 mL | INTRAVENOUS | Status: DC | PRN

## 2016-01-06 MED ORDER — ASPIRIN 81 MG PO CHEW: 81.0000 mg | CHEWABLE_TABLET | ORAL | Status: DC

## 2016-01-06 MED ORDER — OXYCODONE-ACETAMINOPHEN 5-325 MG PO TABS: 1.0000 | ORAL_TABLET | Freq: Four times a day (QID) | ORAL | Status: DC | PRN

## 2016-01-06 MED ORDER — SODIUM CHLORIDE 0.9 % WEIGHT BASED INFUSION: 1.0000 mL/kg/h | INTRAVENOUS | Status: DC

## 2016-01-06 NOTE — Discharge Summary (Signed)
Physician Discharge Summary  Tammi KlippelDavid Bishara ZOX:[id number] DOB: 11/20/1960 DOA: 01/05/2016  PCP: No PCP Per Patient  Admit date: 01/05/2016 Discharge date: 01/06/2016  Recommendations for Outpatient Follow-up:  1. Pt will need to follow up with PCP in 1-2 weeks post discharge 2. Please obtain BMP to evaluate electrolytes and kidney function 3. Please also check CBC to evaluate Hg and Hct levels  Discharge Diagnoses:  Principal Problem:   Chest pain Active Problems:   Essential hypertension   Hyperlipidemia   Morbid obesity (HCC)   Normal coronary arteries-2011  Discharge Condition: Stable  Diet recommendation: Heart healthy diet discussed in details   History of present illness:  55 y/o morbidly obese male, divorced, lives alone, non smoker. He works for [employer], last seen by cardiology in June 2011 for chest pain. Cath then showed normal coronaries and normal LVF. We have not seen him since and he has not had further problems with chest pain till day prior to this admission. Symptoms persisted and he called EMS. Upon EMS arrival patient was given 4 aspirin and a nitroglycerin with some improvement of symptoms.   Hospital Course:  Principal Problem:   Chest pain - EKGs and cardiac markers are negative for evidence of ischemia and infarction. - Unable to tolerate beta blocker therapy due to excessive bradycardia - patient has refused coronary angiography - cardiology team cleared pt for discharge   Active Problems:   Essential hypertension - continue home regimen below    Hyperlipidemia - continue statin per home medical regimen    Morbid obesity (HCC) - Body mass index is 53.79 kg/(m^2).  Procedures/Studies: Dg Chest 2 View  01/04/2016  No active cardiopulmonary disease.   Consultations:  None   Antibiotics:  None  Discharge Exam: Filed Vitals:   01/05/16 2000 01/06/16 0439  BP: 124/50 118/66  Pulse: 52 42  Temp: 98.3 F (36.8 C) 97.7 F (36.5 C)  Resp:  16 16   Filed Vitals:   01/05/16 1301 01/05/16 1332 01/05/16 2000 01/06/16 0439  BP: 121/56 109/47 124/50 118/66  Pulse: 55 57 52 42  Temp:  98.3 F (36.8 C) 98.3 F (36.8 C) 97.7 F (36.5 C)  TempSrc:  Oral Oral Oral  Resp:  18 16 16   Height:      Weight:    174.862 kg (385 lb 8 oz)  SpO2:  96% 97% 96%    General: Pt is alert, follows commands appropriately, not in acute distress Cardiovascular: Regular rhythm, mild bradycardia, S1/S2 +, no murmurs, no rubs, no gallops, LE edema +1 Respiratory: Clear to auscultation bilaterally, no wheezing, no crackles, no rhonchi Abdominal: Soft, non tender, non distended, bowel sounds +, no guarding  Discharge Instructions  Discharge Instructions    Diet - low sodium heart healthy    Complete by:  As directed      Increase activity slowly    Complete by:  As directed             Medication List    TAKE these medications        aspirin 81 MG chewable tablet  Chew 1 tablet (81 mg total) by mouth before cath procedure.     naproxen sodium 220 MG tablet  Commonly known as:  ANAPROX  Take 220 mg by mouth 2 (two) times daily as needed (for pain).     ondansetron 8 MG disintegrating tablet  Commonly known as:  ZOFRAN ODT  Take 1 tablet (8 mg total) by mouth every 8 (  eight) hours as needed for nausea.     oxyCODONE-acetaminophen 5-325 MG tablet  Commonly known as:  PERCOCET  Take 1-2 tablets by mouth every 6 (six) hours as needed.     rosuvastatin 20 MG tablet  Commonly known as:  CRESTOR  Take 20 mg by mouth daily.     valsartan-hydrochlorothiazide 320-25 MG tablet  Commonly known as:  DIOVAN-HCT  Take 1 tablet by mouth daily.            Follow-up Information    Call Debbora Presto, MD.   Specialty:  Internal Medicine   Why:  As needed call my cell phone [phone]    Contact information:   [address] [place] [address] [phone]        The results of significant diagnostics  from this hospitalization (including imaging, microbiology, ancillary and laboratory) are listed below for reference.     Microbiology: No results found for this or any previous visit (from the past 240 hour(s)).   Labs: Basic Metabolic Panel:  Recent Labs Lab 01/04/16 2242  NA 137  K 3.6  CL 104  CO2 26  GLUCOSE 117*  BUN 18  CREATININE 0.93  CALCIUM 8.9   CBC:  Recent Labs Lab 01/04/16 2242  WBC 7.8  HGB 13.4  HCT 40.7  MCV 83.7  PLT 218   Cardiac Enzymes:  Recent Labs Lab 01/05/16 0403 01/05/16 0745 01/05/16 1317 01/05/16 1629 01/06/16 0013  TROPONINI <0.03 <0.03 <0.03 <0.03 <0.03   BNP: BNP (last 3 results)  Recent Labs  01/05/16 0745  BNP 12.7   SIGNED: Time coordinating discharge: 30 minutes  Debbora Presto, MD  [facility] 01/06/2016, 9:36 AM Pager [phone]  If 7PM-7AM, please contact night-coverage [email or web address] Password [email or web address]

## 2016-01-06 NOTE — Progress Notes (Addendum)
Subjective: Some mild discomfort with ambulation   Objective: Vital signs in last 24 hours: Temp:  [97.7 F (36.5 C)-98.3 F (36.8 C)] 97.7 F (36.5 C) (05/26 0439) Pulse Rate:  [42-57] 57 (05/26 0936) Resp:  [16-18] 16 (05/26 0439) BP: (109-140)/(47-73) 140/73 mmHg (05/26 0936) SpO2:  [96 %-97 %] 96 % (05/26 0439) Weight:  [385 lb 8 oz (174.862 kg)] 385 lb 8 oz (174.862 kg) (05/26 0439) Weight change: -7 lb 12.8 oz (-3.538 kg) Last BM Date: 01/04/16 Intake/Output from previous day: + 600 05/25 0701 - 05/26 0700 In: 600 [P.O.:600] Out: -  Intake/Output this shift:    PE: General:Pleasant affect, NAD Skin:Warm and dry, brisk capillary refill HEENT:normocephalic, sclera clear, mucus membranes moist Heart:S1S2 RRR without murmur, gallup, rub or click Lungs:clear without rales, rhonchi, or wheezes HWE:XHBZJAbd:obese, soft, non tender, + BS, do not palpate liver spleen or masses Ext:no to 1+ lower ext edema,  2+ radial pulses Neuro:alert and oriented X 3, MAE, follows commands, + facial symmetry Tele:  SB with HR to 39  Lab Results:  Recent Labs  01/04/16 2242  WBC 7.8  HGB 13.4  HCT 40.7  PLT 218   BMET  Recent Labs  01/04/16 2242  NA 137  K 3.6  CL 104  CO2 26  GLUCOSE 117*  BUN 18  CREATININE 0.93  CALCIUM 8.9    Recent Labs  01/05/16 1629 01/06/16 0013  TROPONINI <0.03 <0.03    Lab Results  Component Value Date   CHOL 115 01/05/2016   HDL 27* 01/05/2016   LDLCALC 59 01/05/2016   TRIG 144 01/05/2016   CHOLHDL 4.3 01/05/2016   Lab Results  Component Value Date   HGBA1C  06/11/2009    5.9 (NOTE) The ADA recommends the following therapeutic goal for glycemic control related to Hgb A1c measurement: Goal of therapy: <6.5 Hgb A1c  Reference: American Diabetes Association: Clinical Practice Recommendations 2010, Diabetes Care, 2010, 33: (Suppl  1).       Hepatic Function Panel No results for input(s): PROT, ALBUMIN, AST, ALT, ALKPHOS,  BILITOT, BILIDIR, IBILI in the last 72 hours.  Recent Labs  01/05/16 1317  CHOL 115   No results for input(s): PROTIME in the last 72 hours.     Studies/Results: Dg Chest 2 View  01/04/2016  CLINICAL DATA:  Chest pain 1 hour.  Hypertension. EXAM: CHEST  2 VIEW COMPARISON:  02/15/2014 FINDINGS: Lungs are adequately inflated and otherwise clear. Borderline cardiomegaly unchanged. Mild spondylosis of the thoracic spine. IMPRESSION: No active cardiopulmonary disease. Electronically Signed   By: Elberta Fortisaniel  Boyle M.D.   On: 01/04/2016 23:25    Medications: I have reviewed the patient's current medications. Scheduled Meds: . enoxaparin (LOVENOX) injection  40 mg Subcutaneous Daily  . irbesartan  300 mg Oral Daily   And  . hydrochlorothiazide  25 mg Oral Daily  . metoprolol tartrate  12.5 mg Oral BID  . rosuvastatin  20 mg Oral Daily   Continuous Infusions:  PRN Meds:.acetaminophen, gi cocktail, morphine injection, nitroGLYCERIN, ondansetron (ZOFRAN) IV, oxyCODONE-acetaminophen  Assessment/Plan: Principal Problem:   Chest pain Active Problems:   Essential hypertension   Hyperlipidemia   Morbid obesity (HCC)   Normal coronary arteries-2011  Neg. MI. Treat risk factors aggressively.  Will follow up to eval for further pain.     Sinus Bradycardia to 39, would stop BB Dr. Katrinka BlazingSmith to see.  Discussed will plan for cath make NPO.  OSA with CPAP    Time spent with pt. :15 minutes. Nada Boozer  Nurse Practitioner Certified Pager [phone] or after 5pm and on weekends call [phone] 01/06/2016, 10:31 AM   The patient has been seen in conjunction with Nada Boozer, NP. All aspects of care have been considered and discussed. The patient has been personally interviewed, examined, and all clinical data has been reviewed.   Having vague complain of exertional chest pressure with activity relieved with rest. EKGs and cardiac markers are negative for evidence of ischemia and  infarction.  Because of risk factor profile and exertional nature of chest discomfort, coronary angiography has been recommended. The patient was counseled to undergo left heart catheterization, coronary angiography, and possible percutaneous coronary intervention with stent implantation. The procedural risks and benefits were discussed in detail. The risks discussed included death, stroke, myocardial infarction, life-threatening bleeding, limb ischemia, kidney injury, allergy, and possible emergency cardiac surgery. The risk of these significant complications were estimated to occur less than 1% of the time. After discussion, the patient has agreed to proceed. Unable to tolerate beta blocker therapy due to excessive bradycardia After long discussion, the patient has refused coronary angiography. We will therefore follow the patient clinically. I do not believe a nuclear study would be helpful. He is eligible for discharge today.

## 2016-01-06 NOTE — Discharge Instructions (Signed)

## 2016-01-12 ENCOUNTER — Encounter: Payer: Self-pay | Admitting: Internal Medicine

## 2016-01-17 ENCOUNTER — Encounter: Payer: Self-pay | Admitting: Internal Medicine

## 2016-01-17 ENCOUNTER — Ambulatory Visit (INDEPENDENT_AMBULATORY_CARE_PROVIDER_SITE_OTHER): Payer: Managed Care, Other (non HMO) | Admitting: Internal Medicine

## 2016-01-17 NOTE — Patient Instructions (Signed)
Medication Instructions:  Your physician recommends that you continue on your current medications as directed. Please refer to the Current Medication list given to you today.  Labwork: None ordered.  Testing/Procedures: None ordered.  Follow-Up: Your physician wants you to follow-up in: 6 months with Dr. Taylor.      Any Other Special Instructions Will Be Listed Below (If Applicable).  If you need a refill on your cardiac medications before your next appointment, please call your pharmacy.   

## 2016-01-17 NOTE — Progress Notes (Signed)
HPI Anthony Fitzpatrick returns today after a long absence from our arrhythmia clinic. I have not seen him in almost 5 years. He is a pleasant 55 yo morbidly obese man with recurrent chest pain. He had a heart cath about 5 years ago. He had normal Coronaries. The patient was in the ED several weeks ago and had chest pain and ruled out. He did not have a heart cath but was felt to have non-cardiac chest pain. He has been unable to lose weight. 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  No Known Allergies   Current Outpatient Prescriptions  Medication Sig Dispense Refill  . aspirin 81 MG chewable tablet Chew 81 mg by mouth daily.    . naproxen sodium (ANAPROX) 220 MG tablet Take 220 mg by mouth 2 (two) times daily as needed (for pain).    . ondansetron (ZOFRAN ODT) 8 MG disintegrating tablet Take 1 tablet (8 mg total) by mouth every 8 (eight) hours as needed for nausea. 10 tablet 0  . oxyCODONE-acetaminophen (PERCOCET) 5-325 MG tablet Take 1-2 tablets by mouth every 6 (six) hours as needed for severe pain (pain).    . rosuvastatin (CRESTOR) 20 MG tablet Take 20 mg by mouth daily.     . valsartan-hydrochlorothiazide (DIOVAN-HCT) 320-25 MG per tablet Take 1 tablet by mouth daily.     No current facility-administered medications for this visit.     Past Medical History  Diagnosis Date  . Hypertension   . High cholesterol   . Morbid obesity (HCC)     BMI 55  . Kidney stones   . OSA on CPAP     ROS:   All systems reviewed and negative except as noted in the HPI.   Past Surgical History  Procedure Laterality Date  . Laparoscopic cholecystectomy  July 2016  . Abdominal hernia repair  July 2016  . Coronary angiogram  June 2011    Normal cors and LV  . Tonsillectomy and adenoidectomy  1990s  . Hernia repair    . Cardiac catheterization  01/2010    "looked fine"     Family History  Problem Relation Age of Onset  . Hypertension        Social History   Social History  . Marital Status: Divorced    Spouse Name: N/A  . Number of Children: N/A  . Years of Education: N/A   Occupational History  . Not on file.   Social History Main Topics  . Smoking status: Never Smoker   . Smokeless tobacco: Never Used  . Alcohol Use: 0.6 oz/week    1 Shots of liquor per week  . Drug Use: Yes    Special: Marijuana, Other-see comments     Comment: 01/05/2016 "smoke marijuana a couple times/month; speed <once/month"  . Sexual Activity: Not Currently   Other Topics Concern  . Not on file   Social History Narrative   Divorced, lives alone. He works for [employer]     BP 124/68 mmHg  Pulse 63  Ht 5\' 10"  (1.778 m)  Wt 398 lb 12.8 oz (180.894 kg)  BMI 57.22 kg/m2  Physical Exam:  Massively obese appearing middle aged man, NAD HEENT: Unremarkable Neck:  6 cm JVD, no thyromegally Lymphatics:  No adenopathy Back:  No CVA tenderness Lungs:  Clear with no wheezes HEART:  Regular rate rhythm, no murmurs, no rubs, no clicks Abd:  soft, positive  bowel sounds, no organomegally, no rebound, no guarding Ext:  2 plus pulses, no edema, no cyanosis, no clubbing Skin:  No rashes no nodules Neuro:  CN II through XII intact, motor grossly intact  DEVICE  Normal device function.  See PaceArt for details.   Assess/Plan: 1. Chest pain - with his normal coronary arteries and the non-exertional nature of his pain, I have recommended he undergo watchful waiting. If his symptoms were to change, he is encouraged to call. 2. Massive obesity - this is his biggest problem. I have warned him about the importance of weight loss. He states that he will try to lose weight. 3. HTN - his blood pressure today is well controlled. He will continue his current meds and is encouraged to lose weight.   Leonia Reeves.D.

## 2018-02-05 LAB — VITAMIN D 25 HYDROXY (VIT D DEFICIENCY, FRACTURES): VIT D 25 HYDROXY: 8.6

## 2018-02-05 LAB — CBC AND DIFFERENTIAL
HCT: 42 (ref 41–53)
Hemoglobin: 14.2 (ref 13.5–17.5)
WBC: 6.8

## 2018-02-05 LAB — TSH: TSH: 3.01 (ref 0.41–5.90)

## 2018-02-05 LAB — BASIC METABOLIC PANEL
Glucose: 111
Potassium: 4 (ref 3.4–5.3)
SODIUM: 137 (ref 137–147)

## 2018-02-05 LAB — LIPID PANEL
Cholesterol: 119 (ref 0–200)
HDL: 32 — AB (ref 35–70)
LDL Cholesterol: 61
LDl/HDL Ratio: 1.9
Triglycerides: 132 (ref 40–160)

## 2018-02-05 LAB — HEPATIC FUNCTION PANEL
ALT: 55 — AB (ref 10–40)
AST: 55 — AB (ref 14–40)

## 2018-02-05 LAB — HEMOGLOBIN A1C: Hgb A1c MFr Bld: 5.6 (ref 4.0–6.0)

## 2018-02-05 LAB — PSA: PSA: 0.7

## 2018-02-20 ENCOUNTER — Other Ambulatory Visit: Payer: Self-pay

## 2018-02-20 DIAGNOSIS — I872 Venous insufficiency (chronic) (peripheral): Secondary | ICD-10-CM

## 2018-04-28 ENCOUNTER — Other Ambulatory Visit: Payer: Self-pay

## 2018-04-28 ENCOUNTER — Ambulatory Visit (HOSPITAL_COMMUNITY)
Admission: RE | Admit: 2018-04-28 | Discharge: 2018-04-28 | Disposition: A | Discharge status: Home / Self Care | Payer: 59 | Source: Ambulatory Visit | Attending: Surgery | Admitting: Surgery

## 2018-04-28 ENCOUNTER — Ambulatory Visit (INDEPENDENT_AMBULATORY_CARE_PROVIDER_SITE_OTHER): Payer: 59 | Admitting: Surgery

## 2018-04-28 ENCOUNTER — Encounter (INDEPENDENT_AMBULATORY_CARE_PROVIDER_SITE_OTHER): Payer: Self-pay

## 2018-04-28 ENCOUNTER — Encounter: Payer: Self-pay | Admitting: Surgery

## 2018-04-28 VITALS — BP 127/65 | HR 61 | Temp 97.8°F | Resp 20 | Ht 70.0 in | Wt >= 6400 oz

## 2018-04-28 DIAGNOSIS — I872 Venous insufficiency (chronic) (peripheral): Secondary | ICD-10-CM | POA: Diagnosis present

## 2018-04-28 NOTE — Progress Notes (Signed)
Vascular and Vein Specialist of [facility]  Patient name: Anthony Fitzpatrick MRN: [id number] DOB: [birth year] Sex: male   REQUESTING PROVIDER:    Arnette Felts, NP   REASON FOR CONSULT:    Leg swelling  HISTORY OF PRESENT ILLNESS:   Anthony Fitzpatrick is a 57 y.o. male, who is who is referred today for bilateral leg swelling which the patient says is been there for many years.  He has also noted discoloration in both ankles.  He has had episodes in the past of drainage.  He has also had episodes of open wounds.  He does not wear compression stockings.  Patient suffers from morbid obesity.  He has a history of recurrent chest pain with normal cardiac catheterization approximately 5 years ago.  He takes a statin for hypercholesterolemia.  He is on a baby aspirin.  He is medically managed for hypertension.   PAST MEDICAL HISTORY    Past Medical History:  Diagnosis Date  . High cholesterol   . Hypertension   . Kidney stones   . Morbid obesity (HCC)    BMI 55  . OSA on CPAP      FAMILY HISTORY   Family History  Problem Relation Age of Onset  . Hypertension Unknown     SOCIAL HISTORY:   Social History   Socioeconomic History  . Marital status: Divorced    Spouse name: Not on file  . Number of children: Not on file  . Years of education: Not on file  . Highest education level: Not on file  Occupational History  . Not on file  Social Needs  . Financial resource strain: Not on file  . Food insecurity:    Worry: Not on file    Inability: Not on file  . Transportation needs:    Medical: Not on file    Non-medical: Not on file  Tobacco Use  . Smoking status: Never Smoker  . Smokeless tobacco: Never Used  Substance and Sexual Activity  . Alcohol use: Yes    Alcohol/week: 1.0 standard drinks    Types: 1 Shots of liquor per week  . Drug use: Yes    Types: Marijuana, Other-see comments    Comment: 01/05/2016 "smoke marijuana a couple  times/month; speed <once/month"  . Sexual activity: Not Currently  Lifestyle  . Physical activity:    Days per week: Not on file    Minutes per session: Not on file  . Stress: Not on file  Relationships  . Social connections:    Talks on phone: Not on file    Gets together: Not on file    Attends religious service: Not on file    Active member of club or organization: Not on file    Attends meetings of clubs or organizations: Not on file    Relationship status: Not on file  . Intimate partner violence:    Fear of current or ex partner: Not on file    Emotionally abused: Not on file    Physically abused: Not on file    Forced sexual activity: Not on file  Other Topics Concern  . Not on file  Social History Narrative   Divorced, lives alone. He works for [employer]    ALLERGIES:    No Known Allergies  CURRENT MEDICATIONS:    Current Outpatient Medications  Medication Sig Dispense Refill  . aspirin 81 MG chewable tablet Chew 81 mg by mouth daily.    . naproxen sodium (ANAPROX) 220 MG tablet  Take 220 mg by mouth 2 (two) times daily as needed (for pain).    . rosuvastatin (CRESTOR) 20 MG tablet Take 20 mg by mouth daily.     . valsartan-hydrochlorothiazide (DIOVAN-HCT) 320-25 MG per tablet Take 1 tablet by mouth daily.     No current facility-administered medications for this visit.     REVIEW OF SYSTEMS:   [X]  denotes positive finding, [ ]  denotes negative finding Cardiac  Comments:  Chest pain or chest pressure:    Shortness of breath upon exertion:    Short of breath when lying flat:    Irregular heart rhythm:        Vascular    Pain in calf, thigh, or hip brought on by ambulation:    Pain in feet at night that wakes you up from your sleep:     Blood clot in your veins:    Leg swelling:         Pulmonary    Oxygen at home:    Productive cough:     Wheezing:         Neurologic    Sudden weakness in arms or legs:     Sudden numbness in arms or legs:       Sudden onset of difficulty speaking or slurred speech:    Temporary loss of vision in one eye:     Problems with dizziness:         Gastrointestinal    Blood in stool:      Vomited blood:         Genitourinary    Burning when urinating:     Blood in urine:        Psychiatric    Major depression:         Hematologic    Bleeding problems:    Problems with blood clotting too easily:        Skin    Rashes or ulcers:        Constitutional    Fever or chills:     PHYSICAL EXAM:   Vitals:   04/28/18 1215  BP: 127/65  Pulse: 61  Resp: 20  Temp: 97.8 F (36.6 C)  TempSrc: Oral  SpO2: 93%  Weight: (!) 421 lb (191 kg)  Height: 5\' 10"  (1.778 m)    GENERAL: The patient is a well-nourished male, in no acute distress. The vital signs are documented above. CARDIAC: There is a regular rate and rhythm.  VASCULAR: 1-2+ pitting edema bilateral lower extremity PULMONARY: Nonlabored respirations ABDOMEN: Soft and non-tender with normal pitched bowel sounds.  MUSCULOSKELETAL: There are no major deformities or cyanosis. NEUROLOGIC: No focal weakness or paresthesias are detected. SKIN: Hyperpigmentation on both lower legs, nearly circumferential at the level of the ankle. PSYCHIATRIC: The patient has a normal affect.  STUDIES:   I have reviewed his venous insufficiency ultrasound with the following findings: Venous Reflux Times Normal value < 0.5 sec +------------------------------+----------+---------+                Right (ms)Left (ms) +------------------------------+----------+---------+ CFV              2933.90        +------------------------------+----------+---------+ FV              1599.00        +------------------------------+----------+---------+ GSV at Saphenofemoral junction     1672.00  +------------------------------+----------+---------+ GSV dist thigh        3638.10         +------------------------------+----------+---------+  GSV at knee          6542.70        +------------------------------+----------+---------+ GSV prox calf         3154.00        +------------------------------+----------+---------+ SSV prox                2604.00  +------------------------------+----------+---------+ SSV mid                 13176.00  +------------------------------+----------+---------+  Vein Diameters: +------------------------------+----------+---------+                Right (cm)Left (cm) +------------------------------+----------+---------+ GSV at Saphenofemoral junction.42    .59    +------------------------------+----------+---------+ GSV at prox thigh       .47    .61    +------------------------------+----------+---------+ GSV at mid thigh       .42    NV     +------------------------------+----------+---------+ GSV at distal thigh      .46    NV     +------------------------------+----------+---------+ GSV at knee          .38    NV     +------------------------------+----------+---------+ GSV prox calf         .39    .19    +------------------------------+----------+---------+ GSV mid calf         .32    .38    +------------------------------+----------+---------+ GSV dist calf              .38    +------------------------------+----------+---------+ SSV origin          .40    nv     +------------------------------+----------+---------+ SSV prox           .25    .40    +------------------------------+----------+---------+ SSV mid            .40    .46    +------------------------------+----------+---------+   Final Interpretation: Right: Abnormal reflux times  were noted in the common femoral vein, femoral vein in the thigh, great saphenous vein at the distal thigh, great saphenous vein at the knee, and great saphenous vein at the prox calf. There is no evidence of deep vein  thrombosis in the lower extremity from the CFV to the popliteal vein.. Left: Abnormal reflux times were noted in the great saphenous vein at the saphenofemoral junction, prox small saphenous vein, and mid small saphenous vein. There is no evidence of deep vein thrombosis in the lower extremity from the CFV to the popliteal  vein.  ASSESSMENT and PLAN   Chronic bilateral lower extremity edema: The patient has CEAP class V disease bilaterally.  Chronic venous insufficiency is a contributing factor, however based off of his ultrasound findings today as well as the vein diameters, I am not sure how much benefit he would get from laser ablation.  I believe the first place to start is with leg elevation and compression.  I have given him information on how to obtain 20-30 compression stockings.  The should be worn as much as possible.  We also discussed keeping his legs elevated whenever possible.  I am making a referral to the lymphedema clinic.  I stressed the importance of this issue, if we cannot manage his edema, he is at high risk for recurrent leg ulcers in the future.  He is going to contact me in the future if he has any additional questions.   Durene Cal, MD Vascular and Vein Specialists of [facility] [phone] Pager [phone]

## 2018-06-04 ENCOUNTER — Encounter: Payer: Self-pay | Admitting: Nurse Practitioner

## 2018-06-04 DIAGNOSIS — G4730 Sleep apnea, unspecified: Secondary | ICD-10-CM | POA: Insufficient documentation

## 2018-06-05 ENCOUNTER — Ambulatory Visit: Payer: Self-pay | Admitting: Nurse Practitioner

## 2018-08-21 ENCOUNTER — Other Ambulatory Visit: Payer: Self-pay | Admitting: Nurse Practitioner

## 2019-01-08 ENCOUNTER — Other Ambulatory Visit: Payer: Self-pay | Admitting: Nurse Practitioner

## 2019-01-08 ENCOUNTER — Telehealth: Payer: Self-pay

## 2019-01-08 NOTE — Telephone Encounter (Signed)
Verbal consent given for virtual visit  

## 2019-01-13 ENCOUNTER — Ambulatory Visit (INDEPENDENT_AMBULATORY_CARE_PROVIDER_SITE_OTHER): Payer: 59 | Admitting: Nurse Practitioner

## 2019-01-13 ENCOUNTER — Encounter: Payer: Self-pay | Admitting: Nurse Practitioner

## 2019-01-13 ENCOUNTER — Other Ambulatory Visit: Payer: Self-pay

## 2019-01-13 DIAGNOSIS — I872 Venous insufficiency (chronic) (peripheral): Secondary | ICD-10-CM | POA: Diagnosis not present

## 2019-01-13 DIAGNOSIS — E782 Mixed hyperlipidemia: Secondary | ICD-10-CM

## 2019-01-13 DIAGNOSIS — I10 Essential (primary) hypertension: Secondary | ICD-10-CM | POA: Diagnosis not present

## 2019-01-13 DIAGNOSIS — Z1159 Encounter for screening for other viral diseases: Secondary | ICD-10-CM

## 2019-01-13 MED ORDER — ROSUVASTATIN CALCIUM 20 MG PO TABS: 20.0000 mg | ORAL_TABLET | Freq: Every day | ORAL | 1 refills | Status: DC

## 2019-01-13 MED ORDER — VALSARTAN-HYDROCHLOROTHIAZIDE 320-25 MG PO TABS: 1.0000 | ORAL_TABLET | Freq: Every day | ORAL | 1 refills | Status: DC

## 2019-01-13 NOTE — Progress Notes (Signed)
Virtual Visit via Video     This visit type was conducted due to national recommendations for restrictions regarding the COVID-19 Pandemic (e.g. social distancing) in an effort to limit this patient's exposure and mitigate transmission in our community.  Patients identity confirmed using two different identifiers.  This format is felt to be most appropriate for this patient at this time.  All issues noted in this document were discussed and addressed.  No physical exam was performed (except for noted visual exam findings with Video Visits).    Date:  01/13/2019   ID:  Anthony Fitzpatrick, DOB September 23, 1960, MRN [id number]  Patient Location:  Home - spoke with Anthony Fitzpatrick  Provider location:   Office    Chief Complaint:  Follow up hypertension  History of Present Illness:    Anthony Fitzpatrick is a 58 y.o. male who presents via video conferencing for a telehealth visit today.    The patient does not have symptoms concerning for COVID-19 infection (fever, chills, cough, or new shortness of breath).   He was seen by Vascular in Sept 2019 and diagnosed with chronic venous insufficiency. He was to be referred to lymphedema clinic and advised to wear compression socks.  Hypertension  This is a chronic problem. The current episode started more than 1 year ago. The problem is unchanged. The problem is uncontrolled. Pertinent negatives include no anxiety, chest pain or palpitations. There are no associated agents to hypertension. Risk factors for coronary artery disease include dyslipidemia, obesity, male gender and sedentary lifestyle. Past treatments include angiotensin blockers and diuretics. There are no compliance problems.  There is no history of angina. There is no history of chronic renal disease.  Hyperlipidemia  This is a chronic problem. The current episode started more than 1 year ago. He has no history of chronic renal disease. There are no known factors aggravating his hyperlipidemia. Pertinent  negatives include no chest pain. The current treatment provides no improvement of lipids. There are no compliance problems.  There are no known risk factors for coronary artery disease.     Past Medical History:  Diagnosis Date   High cholesterol    Hypertension    Kidney stones    Morbid obesity (HCC)    BMI 55   OSA on CPAP    Past Surgical History:  Procedure Laterality Date   ABDOMINAL HERNIA REPAIR  July 2016   CARDIAC CATHETERIZATION  01/2010   "looked fine"   CORONARY ANGIOGRAM  June 2011   Normal cors and LV   HERNIA REPAIR     LAPAROSCOPIC CHOLECYSTECTOMY  July 2016   TONSILLECTOMY AND ADENOIDECTOMY  1990s     Current Meds  Medication Sig   aspirin 81 MG chewable tablet Chew 81 mg by mouth daily.   rosuvastatin (CRESTOR) 20 MG tablet Take 1 tablet (20 mg total) by mouth daily.   valsartan-hydrochlorothiazide (DIOVAN-HCT) 320-25 MG tablet Take 1 tablet by mouth daily.   [DISCONTINUED] rosuvastatin (CRESTOR) 20 MG tablet Take 20 mg by mouth daily.    [DISCONTINUED] valsartan-hydrochlorothiazide (DIOVAN-HCT) 320-25 MG per tablet Take 1 tablet by mouth daily.     Allergies:   Patient has no known allergies.   Social History   Tobacco Use   Smoking status: Never Smoker   Smokeless tobacco: Never Used  Substance Use Topics   Alcohol use: Yes    Alcohol/week: 1.0 standard drinks    Types: 1 Shots of liquor per week   Drug use: Yes  Types: Marijuana, Other-see comments    Comment: 01/05/2016 "smoke marijuana a couple times/month; speed <once/month"     Family Hx: The patient's family history includes Hypertension in his unknown relative.  ROS:   Please see the history of present illness.    Review of Systems  Constitutional: Negative.   Cardiovascular: Positive for leg swelling (bilateral lower extremity edema 2+). Negative for chest pain and palpitations.  Musculoskeletal: Negative.        Bilateral lower extremity edema   Skin:        Darkened skin to bilateral lower ext.  Neurological: Negative for dizziness and tingling.    All other systems reviewed and are negative.   Labs/Other Tests and Data Reviewed:    Recent Labs: 02/05/2018: ALT 55; Hemoglobin 14.2; Potassium 4.0; Sodium 137; TSH 3.01   Recent Lipid Panel Lab Results  Component Value Date/Time   CHOL 119 02/05/2018   TRIG 132 02/05/2018   HDL 32 (A) 02/05/2018   CHOLHDL 4.3 01/05/2016 01:17 PM   LDLCALC 61 02/05/2018    Wt Readings from Last 3 Encounters:  02/05/18 (!) 431 lb (195.5 kg)  04/28/18 (!) 421 lb (191 kg)  01/17/16 (!) 398 lb 12.8 oz (180.9 kg)     Exam:    Vital Signs:  There were no vitals taken for this visit.    Physical Exam  Constitutional: He is oriented to person, place, and time and well-developed, well-nourished, and in no distress.  Musculoskeletal:        General: Edema (bilateral lower extremity edema 2+) present.  Neurological: He is alert and oriented to person, place, and time.  Skin: No erythema.  Darkened pigmented skin to bilateral lower extremities.   Psychiatric: Mood, memory, affect and judgment normal.    ASSESSMENT & PLAN:    1. Essential hypertension  No blood pressure this visit due to virtual and no access to cuff  CMP ordered to check renal function.   The importance of regular exercise and dietary modification was stressed to the patient.  - CMP14 + Anion Gap - Referral to Chronic Care Management Services - valsartan-hydrochlorothiazide (DIOVAN-HCT) 320-25 MG tablet; Take 1 tablet by mouth daily.  Dispense: 90 tablet; Refill: 1  2. Mixed hyperlipidemia  Chronic, controlled  Continue with current medications - CMP14 + Anion Gap - Lipid Profile - Referral to Chronic Care Management Services - rosuvastatin (CRESTOR) 20 MG tablet; Take 1 tablet (20 mg total) by mouth daily.  Dispense: 90 tablet; Refill: 1  3. Chronic venous insufficiency  Seen by Dr. Myra Gianotti and was diagnosed with CEAP  class V disease bilaterally  He was also to have a referral to the lymphedema clinic he is not interested in going at this time due to the cost.   Will refer to CCM for additional resources  He has a dried area to his skin on left lower extremity scabbed he reports related to bumping on something.   - Referral to Chronic Care Management Services  4. Encounter for hepatitis C screening test for low risk patient  Will check for Hepatitis C screening due to being born between the years 1945-1965 - Hepatitis C antibody    COVID-19 Education: The signs and symptoms of COVID-19 were discussed with the patient and how to seek care for testing (follow up with PCP or arrange E-visit).  The importance of social distancing was discussed today.  Patient Risk:   After full review of this patients clinical status, I feel that they  are at least moderate risk at this time.  Time:   Today, I have spent 9 minutes/ seconds with the patient with telehealth technology discussing above diagnoses.     Medication Adjustments/Labs and Tests Ordered: Current medicines are reviewed at length with the patient today.  Concerns regarding medicines are outlined above.   Tests Ordered: Orders Placed This Encounter  Procedures   Hepatitis C antibody   CMP14 + Anion Gap   Lipid Profile   Referral to Chronic Care Management Services    Medication Changes: Meds ordered this encounter  Medications   valsartan-hydrochlorothiazide (DIOVAN-HCT) 320-25 MG tablet    Sig: Take 1 tablet by mouth daily.    Dispense:  90 tablet    Refill:  1   rosuvastatin (CRESTOR) 20 MG tablet    Sig: Take 1 tablet (20 mg total) by mouth daily.    Dispense:  90 tablet    Refill:  1    Disposition:  Follow up in July has appt for his physical  Signed, Arnette FeltsJanece Gwendalyn Mcgonagle, FNP

## 2019-01-14 ENCOUNTER — Other Ambulatory Visit: Payer: 59

## 2019-01-14 ENCOUNTER — Ambulatory Visit: Payer: Self-pay

## 2019-01-14 ENCOUNTER — Other Ambulatory Visit: Payer: Self-pay

## 2019-01-14 DIAGNOSIS — I10 Essential (primary) hypertension: Secondary | ICD-10-CM

## 2019-01-14 DIAGNOSIS — I872 Venous insufficiency (chronic) (peripheral): Secondary | ICD-10-CM

## 2019-01-14 DIAGNOSIS — E782 Mixed hyperlipidemia: Secondary | ICD-10-CM

## 2019-01-14 NOTE — Chronic Care Management (AMB) (Signed)
  Chronic Care Management   Outreach Note  01/14/2019 Name: Anthony Fitzpatrick MRN: [id number] DOB: 1960-09-28  Referred by: Arnette Felts, FNP Reason for referral : Care Coordination   An unsuccessful telephone outreach was attempted today. The patient was referred to the case management team by for assistance with chronic care management and care coordination.   Follow Up Plan: A HIPPA compliant phone message was left for the patient providing contact information and requesting a return call.  The care management team will reach out to the patient again over the next 10 days.   Bevelyn Ngo, BSW, CDP Social Worker, Certified Dementia Practitioner [facility] Care Management [phone]  Total time spent performing care coordination and/or care management activities with the patient by phone or face to face = 5 minutes.

## 2019-01-15 LAB — CMP14 + ANION GAP
ALT: 46 IU/L — ABNORMAL HIGH (ref 0–44)
AST: 46 IU/L — ABNORMAL HIGH (ref 0–40)
Albumin/Globulin Ratio: 1.4 (ref 1.2–2.2)
Albumin: 4.2 g/dL (ref 3.8–4.9)
Alkaline Phosphatase: 63 IU/L (ref 39–117)
Anion Gap: 20 mmol/L — ABNORMAL HIGH (ref 10.0–18.0)
BUN/Creatinine Ratio: 10 (ref 9–20)
BUN: 13 mg/dL (ref 6–24)
Bilirubin Total: 0.7 mg/dL (ref 0.0–1.2)
CO2: 23 mmol/L (ref 20–29)
Calcium: 9.3 mg/dL (ref 8.7–10.2)
Chloride: 98 mmol/L (ref 96–106)
Creatinine, Ser: 1.3 mg/dL — ABNORMAL HIGH (ref 0.76–1.27)
GFR calc Af Amer: 70 mL/min/{1.73_m2} (ref >59)
GFR calc non Af Amer: 61 mL/min/{1.73_m2} (ref >59)
Globulin, Total: 3.1 g/dL (ref 1.5–4.5)
Glucose: 134 mg/dL — ABNORMAL HIGH (ref 65–99)
Potassium: 4.2 mmol/L (ref 3.5–5.2)
Sodium: 141 mmol/L (ref 134–144)
Total Protein: 7.3 g/dL (ref 6.0–8.5)

## 2019-01-15 LAB — LIPID PANEL
Chol/HDL Ratio: 6.6 ratio — ABNORMAL HIGH (ref 0.0–5.0)
Cholesterol, Total: 198 mg/dL (ref 100–199)
HDL: 30 mg/dL — ABNORMAL LOW (ref >39)
LDL Calculated: 125 mg/dL — ABNORMAL HIGH (ref 0–99)
Triglycerides: 214 mg/dL — ABNORMAL HIGH (ref 0–149)
VLDL Cholesterol Cal: 43 mg/dL — ABNORMAL HIGH (ref 5–40)

## 2019-01-15 LAB — HEPATITIS C ANTIBODY: Hep C Virus Ab: <0.1 s/co ratio (ref 0.0–0.9)

## 2019-01-19 ENCOUNTER — Ambulatory Visit: Payer: Self-pay

## 2019-01-19 DIAGNOSIS — I10 Essential (primary) hypertension: Secondary | ICD-10-CM

## 2019-01-19 DIAGNOSIS — E782 Mixed hyperlipidemia: Secondary | ICD-10-CM

## 2019-01-19 NOTE — Chronic Care Management (AMB) (Signed)
  Chronic Care Management   Outreach Note  01/19/2019 Name: Anthony Fitzpatrick MRN: [id number] DOB: Dec 09, 1960  Referred by: [name], FNP Reason for referral : Care Coordination   A second unsuccessful telephone outreach was attempted today. The patient was referred to the case management team for assistance with chronic care management and care coordination.   Follow Up Plan: A HIPPA compliant phone message was left for the patient providing contact information and requesting a return call.  The care management team will reach out to the patient again over the next 7-10 days.   Daneen Schick, BSW, CDP Social Worker, Certified Dementia Practitioner [place] [facility] [place] [facility] [phone]  Total time spent performing care coordination and/or care management activities with the patient by phone or face to face = 5 minutes.

## 2019-01-27 ENCOUNTER — Ambulatory Visit: Payer: Self-pay

## 2019-01-27 NOTE — Chronic Care Management (AMB) (Signed)
  Chronic Care Management   Outreach Note  01/27/2019 Name: Anthony Fitzpatrick MRN: [id number] DOB: Mar 19, 1961  Referred by: [name], FNP Reason for referral : Care Coordination   Third unsuccessful telephone outreach was attempted today. The patient was referred to the case management team for assistance with chronic care management and care coordination. The patient's primary care provider has been notified of our unsuccessful attempts to make or maintain contact with the patient. The care management team is pleased to engage with this patient at any time in the future should he/she be interested in assistance from the care management team.   Follow Up Plan: No further follow up planned at this time.  Daneen Schick, BSW, CDP Social Worker, Certified Dementia Practitioner [facility] [phone]

## 2019-02-11 ENCOUNTER — Encounter: Payer: Self-pay | Admitting: Nurse Practitioner

## 2019-02-11 ENCOUNTER — Other Ambulatory Visit: Payer: Self-pay

## 2019-02-11 ENCOUNTER — Ambulatory Visit (INDEPENDENT_AMBULATORY_CARE_PROVIDER_SITE_OTHER): Payer: 59 | Admitting: Nurse Practitioner

## 2019-02-11 VITALS — BP 128/78 | HR 60 | Temp 97.7°F | Ht 68.6 in | Wt >= 6400 oz

## 2019-02-11 DIAGNOSIS — Z1211 Encounter for screening for malignant neoplasm of colon: Secondary | ICD-10-CM

## 2019-02-11 DIAGNOSIS — I10 Essential (primary) hypertension: Secondary | ICD-10-CM

## 2019-02-11 DIAGNOSIS — I872 Venous insufficiency (chronic) (peripheral): Secondary | ICD-10-CM

## 2019-02-11 DIAGNOSIS — Z139 Encounter for screening, unspecified: Secondary | ICD-10-CM

## 2019-02-11 DIAGNOSIS — R195 Other fecal abnormalities: Secondary | ICD-10-CM | POA: Diagnosis not present

## 2019-02-11 DIAGNOSIS — E782 Mixed hyperlipidemia: Secondary | ICD-10-CM

## 2019-02-11 DIAGNOSIS — Z0000 Encounter for general adult medical examination without abnormal findings: Secondary | ICD-10-CM | POA: Diagnosis not present

## 2019-02-11 DIAGNOSIS — Z125 Encounter for screening for malignant neoplasm of prostate: Secondary | ICD-10-CM | POA: Diagnosis not present

## 2019-02-11 NOTE — Progress Notes (Signed)
Subjective:     Patient ID: Anthony Fitzpatrick , male    DOB: 02/03/1961 , 58 y.o.   MRN: [id number]   Chief Complaint  Patient presents with  . Annual Exam   Men's preventive visit. Patient Health Questionnaire (PHQ-2) is    Office Visit from 02/11/2019 in [facility]  PHQ-2 Total Score  0     Patient is on a Regular diet trying to decrease his intake of carbs, avoiding frozen meals and doing more fresh vegetables.  Marital status: Divorced. Relevant history for alcohol use is:  Social History   Substance and Sexual Activity  Alcohol Use Yes  . Alcohol/week: 1.0 standard drinks  . Types: 1 Shots of liquor per week   Relevant history for tobacco use is:  Social History   Tobacco Use  Smoking Status Never Smoker  Smokeless Tobacco Never Used  Had been walking more until COVID 19.   HPI  Here for HM  Hypertension This is a chronic problem. The current episode started more than 1 year ago. The problem is unchanged. The problem is controlled. Pertinent negatives include no anxiety, chest pain or palpitations. There are no associated agents to hypertension. Past treatments include diuretics and angiotensin blockers. There are no compliance problems.  There is no history of angina. There is no history of chronic renal disease.     Past Medical History:  Diagnosis Date  . High cholesterol   . Hypertension   . Kidney stones   . Morbid obesity (HCC)    BMI 55  . OSA on CPAP      Family History  Problem Relation Age of Onset  . Hypertension Father   . Kidney disease Father   . Hypertension Other   . Cancer Sister      Current Outpatient Medications:  .  aspirin 81 MG chewable tablet, Chew 81 mg by mouth daily., Disp: , Rfl:  .  rosuvastatin (CRESTOR) 20 MG tablet, Take 1 tablet (20 mg total) by mouth daily., Disp: 90 tablet, Rfl: 1 .  valsartan-hydrochlorothiazide (DIOVAN-HCT) 320-25 MG tablet, Take 1 tablet by mouth daily., Disp: 90 tablet, Rfl: 1    No Known Allergies   Review of Systems  Constitutional: Negative.   HENT: Negative.   Eyes: Negative.   Respiratory: Negative.   Cardiovascular: Negative.  Negative for chest pain, palpitations and leg swelling.  Gastrointestinal: Negative.   Endocrine: Negative.   Genitourinary: Negative.   Musculoskeletal: Negative.   Skin: Negative.        Bilateral lower extremities   Neurological: Negative.   Hematological: Negative.   Psychiatric/Behavioral: Negative.      Today's Vitals   02/11/19 1017  BP: 128/78  Pulse: 60  Temp: 97.7 F (36.5 C)  TempSrc: Oral  SpO2: 98%  Weight: (!) 417 lb 9.6 oz (189.4 kg)  Height: 5' 8.6" (1.742 m)   Body mass index is 62.39 kg/m.   Objective:  Physical Exam Vitals signs reviewed.  Constitutional:      Appearance: Normal appearance. He is obese.     Comments: Morbid obesity  HENT:     Head: Normocephalic and atraumatic.     Right Ear: Tympanic membrane, ear canal and external ear normal. There is no impacted cerumen.     Left Ear: Tympanic membrane, ear canal and external ear normal. There is no impacted cerumen.  Eyes:     Extraocular Movements: Extraocular movements intact.     Conjunctiva/sclera: Conjunctivae normal.  Pupils: Pupils are equal, round, and reactive to light.  Neck:     Musculoskeletal: Normal range of motion and neck supple.  Cardiovascular:     Rate and Rhythm: Normal rate and regular rhythm.     Pulses: Normal pulses.     Heart sounds: Normal heart sounds. No murmur.  Pulmonary:     Effort: Pulmonary effort is normal. No respiratory distress.     Breath sounds: Normal breath sounds.  Abdominal:     General: Abdomen is flat. Bowel sounds are normal. There is no distension.     Palpations: Abdomen is soft.  Genitourinary:    Rectum: Guaiac result positive.  Musculoskeletal: Normal range of motion.  Skin:    General: Skin is warm.     Capillary Refill: Capillary refill takes less than 2 seconds.   Neurological:     General: No focal deficit present.     Mental Status: He is alert and oriented to person, place, and time.  Psychiatric:        Mood and Affect: Mood normal.        Behavior: Behavior normal.        Thought Content: Thought content normal.        Judgment: Judgment normal.         Assessment And Plan:     1. Health maintenance examination . Behavior modifications discussed and diet history reviewed.   . Pt will continue to exercise regularly and modify diet with low GI, plant based foods and decrease intake of processed foods.  . Recommend intake of daily multivitamin, Vitamin D, and calcium.  . Recommend colonoscopy for preventive screenings, as well as recommend immunizations that include TDAP, - CBC no Diff - Lipid panel - CMP14 + Anion Gap  2. Encounter for prostate cancer screening  - PSA  3. Encounter for screening colonoscopy  According to USPTF Colorectal cancer Screening guidelines. Colonoscopy is recommended every 10 years, starting at age 61years.  Will refer to GI for colon cancer screening. - Ambulatory referral to Gastroenterology  4. Encounter for screening  - HIV antibody (with reflex)  5. Encounter for Hemoccult screening  Slightly positive hemocult will refer to GI for further evaluation  6. Mixed hyperlipidemia  Chronic, controlled  Continue with current medications  7. Chronic venous insufficiency  Stable, no current ulcerations present  8. Essential hypertension . B/P is well controlled.  . CMP ordered to check renal function.  . The importance of regular exercise and dietary modification was stressed to the patient.  . Stressed importance of losing ten percent of her body weight to help with B/P control.  . The weight loss would help with decreasing cardiac and cancer risk as well.   . EKG normal sinus rhythm - EKG 12-Lead - POCT Urinalysis Dipstick (81002) - POCT UA - Microalbumin  9. Heme positive stool  Will  refer to GI for further evaluation   [name], FNP    THE PATIENT IS ENCOURAGED TO PRACTICE SOCIAL DISTANCING DUE TO THE COVID-19 PANDEMIC.

## 2019-02-11 NOTE — Patient Instructions (Signed)
Health Maintenance, Male Adopting a healthy lifestyle and getting preventive care are important in promoting health and wellness. Ask your health care provider about:  The right schedule for you to have regular tests and exams.  Things you can do on your own to prevent diseases and keep yourself healthy. What should I know about diet, weight, and exercise? Eat a healthy diet   Eat a diet that includes plenty of vegetables, fruits, low-fat dairy products, and lean protein.  Do not eat a lot of foods that are high in solid fats, added sugars, or sodium. Maintain a healthy weight Body mass index (BMI) is a measurement that can be used to identify possible weight problems. It estimates body fat based on height and weight. Your health care provider can help determine your BMI and help you achieve or maintain a healthy weight. Get regular exercise Get regular exercise. This is one of the most important things you can do for your health. Most adults should:  Exercise for at least 150 minutes each week. The exercise should increase your heart rate and make you sweat (moderate-intensity exercise).  Do strengthening exercises at least twice a week. This is in addition to the moderate-intensity exercise.  Spend less time sitting. Even light physical activity can be beneficial. Watch cholesterol and blood lipids Have your blood tested for lipids and cholesterol at 57 years of age, then have this test every 5 years. You may need to have your cholesterol levels checked more often if:  Your lipid or cholesterol levels are high.  You are older than 58 years of age.  You are at high risk for heart disease. What should I know about cancer screening? Many types of cancers can be detected early and may often be prevented. Depending on your health history and family history, you may need to have cancer screening at various ages. This may include screening for:  Colorectal cancer.  Prostate cancer.   Skin cancer.  Lung cancer. What should I know about heart disease, diabetes, and high blood pressure? Blood pressure and heart disease  High blood pressure causes heart disease and increases the risk of stroke. This is more likely to develop in people who have high blood pressure readings, are of African descent, or are overweight.  Talk with your health care provider about your target blood pressure readings.  Have your blood pressure checked: ? Every 3-5 years if you are 79-12 years of age. ? Every year if you are 26 years old or older.  If you are between the ages of 57 and 39 and are a current or former smoker, ask your health care provider if you should have a one-time screening for abdominal aortic aneurysm (AAA). Diabetes Have regular diabetes screenings. This checks your fasting blood sugar level. Have the screening done:  Once every three years after age 15 if you are at a normal weight and have a low risk for diabetes.  More often and at a younger age if you are overweight or have a high risk for diabetes. What should I know about preventing infection? Hepatitis B If you have a higher risk for hepatitis B, you should be screened for this virus. Talk with your health care provider to find out if you are at risk for hepatitis B infection. Hepatitis C Blood testing is recommended for:  Everyone born from [birth year] through 1965.  Anyone with known risk factors for hepatitis C. Sexually transmitted infections (STIs)  You should be screened each year  for STIs, including gonorrhea and chlamydia, if: ? You are sexually active and are younger than 58 years of age. ? You are older than 58 years of age and your health care provider tells you that you are at risk for this type of infection. ? Your sexual activity has changed since you were last screened, and you are at increased risk for chlamydia or gonorrhea. Ask your health care provider if you are at risk.  Ask your health care  provider about whether you are at high risk for HIV. Your health care provider may recommend a prescription medicine to help prevent HIV infection. If you choose to take medicine to prevent HIV, you should first get tested for HIV. You should then be tested every 3 months for as long as you are taking the medicine. Follow these instructions at home: Lifestyle  Do not use any products that contain nicotine or tobacco, such as cigarettes, e-cigarettes, and chewing tobacco. If you need help quitting, ask your health care provider.  Do not use street drugs.  Do not share needles.  Ask your health care provider for help if you need support or information about quitting drugs. Alcohol use  Do not drink alcohol if your health care provider tells you not to drink.  If you drink alcohol: ? Limit how much you have to 0-2 drinks a day. ? Be aware of how much alcohol is in your drink. In the [place] one drink equals one 12 oz bottle of beer (355 mL), one 5 oz glass of wine (148 mL), or one 1 oz glass of hard liquor (44 mL). General instructions  Schedule regular health, dental, and eye exams.  Stay current with your vaccines.  Tell your health care provider if: ? You often feel depressed. ? You have ever been abused or do not feel safe at home. Summary  Adopting a healthy lifestyle and getting preventive care are important in promoting health and wellness.  Follow your health care provider's instructions about healthy diet, exercising, and getting tested or screened for diseases.  Follow your health care provider's instructions on monitoring your cholesterol and blood pressure. This information is not intended to replace advice given to you by your health care provider. Make sure you discuss any questions you have with your health care provider. Document Released: 01/26/2008 Document Revised: 07/23/2018 Document Reviewed: 07/23/2018 Elsevier Patient Education  2020 [place]   Walking is a great form of exercise to increase your strength, endurance and overall fitness.  A walking program can help you start slowly and gradually build endurance as you go.  Everyone's ability is different, so each person's starting point will be different.  You do not have to follow them exactly.  The are just samples. You should simply find out what's right for you and stick to that program.   In the beginning, you'll start off walking 2-3 times a day for short distances.  As you get stronger, you'll be walking further at just 1-2 times per day.  A. You Can Walk For A Certain Length Of Time Each Day    Walk 5 minutes 3 times per day.  Increase 2 minutes every 2 days (3 times per day).  Work up to 25-30 minutes (1-2 times per day).   Example:   Day 1-2 5 minutes 3 times per day   Day 7-8 12 minutes 2-3 times per day   Day 13-14 25 minutes 1-2 times per day  B. You  Can Walk For a Certain Distance Each Day     Distance can be substituted for time.    Example:   3 trips to mailbox (at road)   3 trips to corner of block   3 trips around the block  C. Go to local high school and use the track.      Health Maintenance  Topic Date Due  . HIV Screening  05/21/1976  . COLONOSCOPY  05/22/2011  . INFLUENZA VACCINE  03/14/2019  . TETANUS/TDAP  01/30/2027  . Hepatitis C Screening  Completed

## 2019-02-12 ENCOUNTER — Telehealth: Payer: Self-pay

## 2019-02-12 LAB — LIPID PANEL
Chol/HDL Ratio: 4.5 ratio (ref 0.0–5.0)
Cholesterol, Total: 125 mg/dL (ref 100–199)
HDL: 28 mg/dL — ABNORMAL LOW (ref >39)
LDL Calculated: 64 mg/dL (ref 0–99)
Triglycerides: 166 mg/dL — ABNORMAL HIGH (ref 0–149)
VLDL Cholesterol Cal: 33 mg/dL (ref 5–40)

## 2019-02-12 LAB — CMP14 + ANION GAP
ALT: 36 IU/L (ref 0–44)
AST: 41 IU/L — ABNORMAL HIGH (ref 0–40)
Albumin/Globulin Ratio: 1.2 (ref 1.2–2.2)
Albumin: 4.1 g/dL (ref 3.8–4.9)
Alkaline Phosphatase: 67 IU/L (ref 39–117)
Anion Gap: 16 mmol/L (ref 10.0–18.0)
BUN/Creatinine Ratio: 13 (ref 9–20)
BUN: 16 mg/dL (ref 6–24)
Bilirubin Total: 1 mg/dL (ref 0.0–1.2)
CO2: 21 mmol/L (ref 20–29)
Calcium: 9 mg/dL (ref 8.7–10.2)
Chloride: 101 mmol/L (ref 96–106)
Creatinine, Ser: 1.2 mg/dL (ref 0.76–1.27)
GFR calc Af Amer: 77 mL/min/{1.73_m2} (ref >59)
GFR calc non Af Amer: 67 mL/min/{1.73_m2} (ref >59)
Globulin, Total: 3.3 g/dL (ref 1.5–4.5)
Glucose: 122 mg/dL — ABNORMAL HIGH (ref 65–99)
Potassium: 4 mmol/L (ref 3.5–5.2)
Sodium: 138 mmol/L (ref 134–144)
Total Protein: 7.4 g/dL (ref 6.0–8.5)

## 2019-02-12 LAB — CBC
Hematocrit: 43.2 % (ref 37.5–51.0)
Hemoglobin: 14 g/dL (ref 13.0–17.7)
MCH: 27.9 pg (ref 26.6–33.0)
MCHC: 32.4 g/dL (ref 31.5–35.7)
MCV: 86 fL (ref 79–97)
Platelets: 262 10*3/uL (ref 150–450)
RBC: 5.02 x10E6/uL (ref 4.14–5.80)
RDW: 16.2 % — ABNORMAL HIGH (ref 11.6–15.4)
WBC: 7.1 10*3/uL (ref 3.4–10.8)

## 2019-02-12 LAB — HIV ANTIBODY (ROUTINE TESTING W REFLEX): HIV Screen 4th Generation wRfx: NONREACTIVE

## 2019-02-12 LAB — PSA: Prostate Specific Ag, Serum: 0.7 ng/mL (ref 0.0–4.0)

## 2019-02-12 NOTE — Telephone Encounter (Signed)
1st attempt to give lab results  

## 2019-02-12 NOTE — Telephone Encounter (Signed)
-----   Message from [name], [place] sent at 02/12/2019  8:43 AM EDT ----- Negative HIV.  Blood levels are normal. Triglycerides have improved since one month ago down to 166 from 214.  Your liver functions are improving as well.  PSA is normal.

## 2019-02-16 ENCOUNTER — Telehealth: Payer: Self-pay

## 2019-02-16 NOTE — Telephone Encounter (Signed)
-----   Message from Janece Moore, FNP sent at 02/12/2019  8:43 AM EDT ----- Negative HIV.  Blood levels are normal. Triglycerides have improved since one month ago down to 166 from 214.  Your liver functions are improving as well.  PSA is normal. 

## 2019-02-16 NOTE — Telephone Encounter (Signed)
1ST ATTEMPT TO GIVE RESULTS 

## 2019-04-06 ENCOUNTER — Encounter: Payer: Self-pay | Admitting: Nurse Practitioner

## 2019-05-20 ENCOUNTER — Ambulatory Visit: Payer: 59 | Admitting: Nurse Practitioner

## 2019-08-04 ENCOUNTER — Other Ambulatory Visit: Payer: Self-pay | Admitting: Nurse Practitioner

## 2019-08-04 DIAGNOSIS — E782 Mixed hyperlipidemia: Secondary | ICD-10-CM

## 2019-08-04 DIAGNOSIS — I10 Essential (primary) hypertension: Secondary | ICD-10-CM

## 2020-02-07 ENCOUNTER — Other Ambulatory Visit: Payer: Self-pay | Admitting: Nurse Practitioner

## 2020-02-07 DIAGNOSIS — I10 Essential (primary) hypertension: Secondary | ICD-10-CM

## 2020-02-07 DIAGNOSIS — E782 Mixed hyperlipidemia: Secondary | ICD-10-CM

## 2020-02-17 ENCOUNTER — Encounter: Payer: 59 | Admitting: Nurse Practitioner

## 2020-07-26 ENCOUNTER — Other Ambulatory Visit: Payer: Self-pay

## 2020-07-26 ENCOUNTER — Ambulatory Visit (INDEPENDENT_AMBULATORY_CARE_PROVIDER_SITE_OTHER): Payer: 59 | Admitting: Nurse Practitioner

## 2020-07-26 ENCOUNTER — Encounter: Payer: Self-pay | Admitting: Nurse Practitioner

## 2020-07-26 VITALS — BP 134/80 | HR 6 | Temp 98.1°F | Ht 68.6 in | Wt >= 6400 oz

## 2020-07-26 DIAGNOSIS — E782 Mixed hyperlipidemia: Secondary | ICD-10-CM

## 2020-07-26 DIAGNOSIS — Z1211 Encounter for screening for malignant neoplasm of colon: Secondary | ICD-10-CM

## 2020-07-26 DIAGNOSIS — I10 Essential (primary) hypertension: Secondary | ICD-10-CM | POA: Diagnosis not present

## 2020-07-26 DIAGNOSIS — Z6841 Body Mass Index (BMI) 40.0 and over, adult: Secondary | ICD-10-CM

## 2020-07-26 DIAGNOSIS — I872 Venous insufficiency (chronic) (peripheral): Secondary | ICD-10-CM | POA: Diagnosis not present

## 2020-07-26 NOTE — Patient Instructions (Signed)

## 2020-07-26 NOTE — Progress Notes (Signed)
I,[employer] as a Education administrator for [employer], FNP.,have documented all relevant documentation on the behalf of Anthony Brine, FNP,as directed by  Anthony Brine, FNP while in the presence of Anthony Fitzpatrick, Anthony Fitzpatrick. This visit occurred during the SARS-CoV-2 public health emergency.  Safety protocols were in place, including screening questions prior to the visit, additional usage of staff PPE, and extensive cleaning of exam room while observing appropriate contact time as indicated for disinfecting solutions.  Subjective:     Patient ID: Anthony Fitzpatrick , male    DOB: 05-17-1961 , 59 y.o.   MRN: [id number]   Chief Complaint  Patient presents with   Hypertension   Hyperlipidemia    HPI  Patient here for a f/u on his blood pressure and cholesterol.  Wt Readings from Last 3 Encounters: 07/26/20 : (!) 423 lb 3.2 oz (192 kg) 02/11/19 : (!) 417 lb 9.6 oz (189.4 kg) 02/05/18 : (!) 431 lb (195.5 kg)  He did not go for his colonoscopy according to the referral note he did not return the call. Will refer again. Hypertension This is a chronic problem. The current episode started more than 1 year ago. The problem is unchanged. The problem is controlled. Pertinent negatives include no anxiety, chest pain, headaches or palpitations. There are no associated agents to hypertension. Past treatments include diuretics and angiotensin blockers. There are no compliance problems.  There is no history of angina. There is no history of chronic renal disease.     Past Medical History:  Diagnosis Date   High cholesterol    Hypertension    Kidney stones    Morbid obesity (HCC)    BMI 55   OSA on CPAP      Family History  Problem Relation Age of Onset   Hypertension Father    Kidney disease Father    Hypertension Other    Cancer Sister      Current Outpatient Medications:    rosuvastatin (CRESTOR) 20 MG tablet, TAKE 1 TABLET BY MOUTH EVERY DAY, Disp: 90 tablet, Rfl: 1    valsartan-hydrochlorothiazide (DIOVAN-HCT) 320-25 MG tablet, TAKE 1 TABLET BY MOUTH EVERY DAY, Disp: 90 tablet, Rfl: 1   aspirin 81 MG chewable tablet, Chew 81 mg by mouth daily. (Patient not taking: Reported on 07/26/2020), Disp: , Rfl:    No Known Allergies   Review of Systems  Constitutional: Negative.   Respiratory: Negative.   Cardiovascular: Negative for chest pain, palpitations and leg swelling.  Neurological: Negative for dizziness and headaches.  Psychiatric/Behavioral: Negative.      Today's Vitals   07/26/20 1610  BP: 134/80  Pulse: (!) 6  Temp: 98.1 F (36.7 C)  TempSrc: Oral  Weight: (!) 423 lb 3.2 oz (192 kg)  Height: 5' 8.6" (1.742 m)  PainSc: 0-No pain   Body mass index is 63.23 kg/m.   Objective:  Physical Exam Vitals reviewed.  Constitutional:      Appearance: Normal appearance. He is obese.     Comments: Morbid obesity  HENT:     Head: Normocephalic and atraumatic.  Eyes:     Extraocular Movements: Extraocular movements intact.     Pupils: Pupils are equal, round, and reactive to light.  Cardiovascular:     Rate and Rhythm: Normal rate and regular rhythm.     Pulses: Normal pulses.     Heart sounds: Normal heart sounds. No murmur heard.   Pulmonary:     Effort: Pulmonary effort is normal. No respiratory distress.  Breath sounds: Normal breath sounds.  Abdominal:     General: Abdomen is flat. Bowel sounds are normal. There is no distension.     Palpations: Abdomen is soft.  Genitourinary:    Rectum: Guaiac result positive.  Musculoskeletal:        General: Normal range of motion.     Cervical back: Normal range of motion and neck supple.  Skin:    General: Skin is warm.     Capillary Refill: Capillary refill takes less than 2 seconds.  Neurological:     General: No focal deficit present.     Mental Status: He is alert and oriented to person, place, and time.  Psychiatric:        Mood and Affect: Mood normal.        Behavior:  Behavior normal.        Thought Content: Thought content normal.        Judgment: Judgment normal.         Assessment And Plan:     1. Mixed hyperlipidemia  Chronic, controlled  Continue with current medications, tolerating well - Lipid panel  2. Essential hypertension  Chronic, fair control  Continue with current medications - CMP14+EGFR  3. Encounter for screening colonoscopy  According to USPTF Colorectal cancer Screening guidelines. Colonoscopy is recommended every 10 years, starting at age 28years.  Will refer to GI for colon cancer screening., I have redone his referral and stressed the importance of going as he had positive hemocult - Ambulatory referral to Gastroenterology  4. Morbid obesity (HCC)  Chronic  Discussed healthy diet and regular exercise options   Encouraged to exercise at least 150 minutes per week with 2 days of strength training  Given handout for [email or web address] 10 tips, CDC exercise for adults and CDC Eat More Weight Less - Hemoglobin A1c  5. BMI 60.0-69.9, adult ([place])  Offered to refer to weight management but he declines - Hemoglobin A1c  6. Chronic venous insufficiency  has discoloration to bilateral lower extremities, he does not feel has changed since last visit He did see vascular over a year ago and had ABI done     Patient was given opportunity to ask questions. Patient verbalized understanding of the plan and was able to repeat key elements of the plan. All questions were answered to their satisfaction.   Anthony Bradley, FNP, have reviewed all documentation for this visit. The documentation on 07/27/20 for the exam, diagnosis, procedures, and orders are all accurate and complete.  THE PATIENT IS ENCOURAGED TO PRACTICE SOCIAL DISTANCING DUE TO THE COVID-19 PANDEMIC.

## 2020-07-27 LAB — HEMOGLOBIN A1C
Est. average glucose Bld gHb Est-mCnc: 128 mg/dL
Hgb A1c MFr Bld: 6.1 % — ABNORMAL HIGH (ref 4.8–5.6)

## 2020-07-27 LAB — CMP14+EGFR
ALT: 31 IU/L (ref 0–44)
AST: 36 IU/L (ref 0–40)
Albumin/Globulin Ratio: 1.2 (ref 1.2–2.2)
Albumin: 3.9 g/dL (ref 3.8–4.9)
Alkaline Phosphatase: 76 IU/L (ref 44–121)
BUN/Creatinine Ratio: 9 (ref 9–20)
BUN: 9 mg/dL (ref 6–24)
Bilirubin Total: 0.7 mg/dL (ref 0.0–1.2)
CO2: 24 mmol/L (ref 20–29)
Calcium: 8.5 mg/dL — ABNORMAL LOW (ref 8.7–10.2)
Chloride: 103 mmol/L (ref 96–106)
Creatinine, Ser: 0.98 mg/dL (ref 0.76–1.27)
GFR calc Af Amer: 97 mL/min/{1.73_m2} (ref >59)
GFR calc non Af Amer: 84 mL/min/{1.73_m2} (ref >59)
Globulin, Total: 3.2 g/dL (ref 1.5–4.5)
Glucose: 106 mg/dL — ABNORMAL HIGH (ref 65–99)
Potassium: 4.4 mmol/L (ref 3.5–5.2)
Sodium: 139 mmol/L (ref 134–144)
Total Protein: 7.1 g/dL (ref 6.0–8.5)

## 2020-07-27 LAB — LIPID PANEL
Chol/HDL Ratio: 5.1 ratio — ABNORMAL HIGH (ref 0.0–5.0)
Cholesterol, Total: 132 mg/dL (ref 100–199)
HDL: 26 mg/dL — ABNORMAL LOW (ref >39)
LDL Chol Calc (NIH): 70 mg/dL (ref 0–99)
Triglycerides: 219 mg/dL — ABNORMAL HIGH (ref 0–149)
VLDL Cholesterol Cal: 36 mg/dL (ref 5–40)

## 2020-07-28 ENCOUNTER — Other Ambulatory Visit: Payer: Self-pay | Admitting: Nurse Practitioner

## 2020-07-28 DIAGNOSIS — E782 Mixed hyperlipidemia: Secondary | ICD-10-CM

## 2020-07-28 DIAGNOSIS — I10 Essential (primary) hypertension: Secondary | ICD-10-CM

## 2020-08-09 ENCOUNTER — Encounter: Payer: Self-pay | Admitting: Nurse Practitioner

## 2020-08-16 ENCOUNTER — Ambulatory Visit: Payer: 59 | Admitting: Nurse Practitioner

## 2020-08-30 ENCOUNTER — Ambulatory Visit: Payer: 59 | Admitting: Nurse Practitioner

## 2020-08-30 ENCOUNTER — Encounter: Payer: Self-pay | Admitting: Nurse Practitioner

## 2020-12-20 ENCOUNTER — Encounter: Payer: Self-pay | Admitting: Nurse Practitioner

## 2021-01-19 ENCOUNTER — Other Ambulatory Visit: Payer: Self-pay | Admitting: Nurse Practitioner

## 2021-01-19 DIAGNOSIS — E782 Mixed hyperlipidemia: Secondary | ICD-10-CM

## 2021-01-19 DIAGNOSIS — I10 Essential (primary) hypertension: Secondary | ICD-10-CM

## 2021-01-23 ENCOUNTER — Ambulatory Visit (INDEPENDENT_AMBULATORY_CARE_PROVIDER_SITE_OTHER): Payer: 59 | Admitting: Nurse Practitioner

## 2021-01-23 ENCOUNTER — Encounter: Payer: Self-pay | Admitting: Nurse Practitioner

## 2021-01-23 ENCOUNTER — Other Ambulatory Visit: Payer: Self-pay

## 2021-01-23 VITALS — BP 124/80 | HR 60 | Temp 98.2°F | Ht 68.6 in | Wt >= 6400 oz

## 2021-01-23 DIAGNOSIS — E782 Mixed hyperlipidemia: Secondary | ICD-10-CM | POA: Diagnosis not present

## 2021-01-23 DIAGNOSIS — I10 Essential (primary) hypertension: Secondary | ICD-10-CM

## 2021-01-23 DIAGNOSIS — Z9989 Dependence on other enabling machines and devices: Secondary | ICD-10-CM

## 2021-01-23 DIAGNOSIS — Z0001 Encounter for general adult medical examination with abnormal findings: Secondary | ICD-10-CM

## 2021-01-23 DIAGNOSIS — L97222 Non-pressure chronic ulcer of left calf with fat layer exposed: Secondary | ICD-10-CM

## 2021-01-23 DIAGNOSIS — R600 Localized edema: Secondary | ICD-10-CM

## 2021-01-23 DIAGNOSIS — I872 Venous insufficiency (chronic) (peripheral): Secondary | ICD-10-CM | POA: Diagnosis not present

## 2021-01-23 DIAGNOSIS — Z6841 Body Mass Index (BMI) 40.0 and over, adult: Secondary | ICD-10-CM

## 2021-01-23 DIAGNOSIS — Z125 Encounter for screening for malignant neoplasm of prostate: Secondary | ICD-10-CM

## 2021-01-23 DIAGNOSIS — R7309 Other abnormal glucose: Secondary | ICD-10-CM

## 2021-01-23 DIAGNOSIS — Z0000 Encounter for general adult medical examination without abnormal findings: Secondary | ICD-10-CM

## 2021-01-23 MED ORDER — SHINGRIX 50 MCG/0.5ML IM SUSR: 0.5000 mL | Freq: Once | INTRAMUSCULAR | 0 refills | Status: AC

## 2021-01-23 MED ORDER — FUROSEMIDE 20 MG PO TABS: ORAL_TABLET | ORAL | 2 refills | Status: DC

## 2021-01-23 MED ORDER — MUPIROCIN CALCIUM 2 % EX CREA: TOPICAL_CREAM | CUTANEOUS | 2 refills | Status: AC

## 2021-01-23 NOTE — Progress Notes (Signed)
I,Anthony Fitzpatrick,acting as a Education administrator for [employer], FNP.,have documented all relevant documentation on the behalf of Anthony Brine, FNP,as directed by  Anthony Brine, FNP while in the presence of Anthony Fitzpatrick, Anthony Fitzpatrick.  This visit occurred during the SARS-CoV-2 public health emergency.  Safety protocols were in place, including screening questions prior to the visit, additional usage of staff PPE, and extensive cleaning of exam room while observing appropriate contact time as indicated for disinfecting solutions.  Subjective:     Patient ID: Anthony Fitzpatrick , male    DOB: Jan 19, 1961 , 60 y.o.   MRN: [id number]   Chief Complaint  Patient presents with   Annual Exam    HPI  Patient is here for physical exam. He reports compliance with medications. He has no concerns at this time.   Wt Readings from Last 3 Encounters: 01/23/21 : (!) 412 lb 3.2 oz (187 kg) 07/26/20 : (!) 423 lb 3.2 oz (192 kg) 02/11/19 : (!) 417 lb 9.6 oz (189.4 kg)  Reports he has a CPAP machine that is indicating it has outlived its battery life. He has not had a sleep study in over 10 years. [facility] and supplies. He uses a mask.  He is unable to sleep more than 45 minutes without his CPAP.     Past Medical History:  Diagnosis Date   High cholesterol    Hypertension    Kidney stones    Morbid obesity (HCC)    BMI 55   OSA on CPAP      Family History  Problem Relation Age of Onset   Hypertension Father    Kidney disease Father    Hypertension Other    Cancer Sister      Current Outpatient Medications:    aspirin 81 MG chewable tablet, Chew 81 mg by mouth daily., Disp: , Rfl:    furosemide (LASIX) 20 MG tablet, Take 1 tablet by mouth x 3 days as needed for leg swelling, Disp: 30 tablet, Rfl: 2   mupirocin cream (BACTROBAN) 2 %, Apply to affected area 3 times daily, Disp: 30 g, Rfl: 2   rosuvastatin (CRESTOR) 20 MG tablet, TAKE 1 TABLET BY MOUTH EVERY DAY, Disp: 90  tablet, Rfl: 1   valsartan-hydrochlorothiazide (DIOVAN-HCT) 320-25 MG tablet, TAKE 1 TABLET BY MOUTH EVERY DAY, Disp: 90 tablet, Rfl: 1   No Known Allergies   Men's preventive visit. Patient Health Questionnaire (PHQ-2) is  [place] [facility] Visit from 07/26/2020 in [facility]  PHQ-2 Total Score 0      Patient is on a Regular diet; he is trying to eat more healthy options.  He is exercising daily with walking approximately 15 minutes each time. Marital status: Divorced. Relevant history for alcohol use is:  Social History   Substance and Sexual Activity  Alcohol Use Yes   Alcohol/week: 1.0 standard drink   Types: 1 Shots of liquor per week   Relevant history for tobacco use is:  Social History   Tobacco Use  Smoking Status Never  Smokeless Tobacco Never  .   Review of Systems  Constitutional: Negative.   HENT: Negative.    Eyes: Negative.   Respiratory: Negative.    Cardiovascular: Negative.   Gastrointestinal: Negative.   Endocrine: Negative.   Genitourinary: Negative.   Musculoskeletal: Negative.   Skin: Negative.   Allergic/Immunologic: Negative.   Neurological: Negative.   Hematological: Negative.   Psychiatric/Behavioral: Negative.  Today's Vitals   01/23/21 0838  BP: 124/80  Pulse: 60  Temp: 98.2 F (36.8 C)  Weight: (!) 412 lb 3.2 oz (187 kg)  Height: 5' 8.6" (1.742 m)   Body mass index is 61.58 kg/m.   Objective:  Physical Exam Vitals reviewed.  Constitutional:      Appearance: Normal appearance. He is obese.     Comments: Morbid obesity  HENT:     Head: Normocephalic and atraumatic.     Right Ear: Tympanic membrane, ear canal and external ear normal. There is no impacted cerumen.     Left Ear: Tympanic membrane, ear canal and external ear normal. There is no impacted cerumen.  Eyes:     Extraocular Movements: Extraocular movements intact.     Conjunctiva/sclera: Conjunctivae normal.     Pupils: Pupils are  equal, round, and reactive to light.  Cardiovascular:     Rate and Rhythm: Normal rate and regular rhythm.     Pulses: Normal pulses.     Heart sounds: Normal heart sounds. No murmur heard. Pulmonary:     Effort: Pulmonary effort is normal. No respiratory distress.     Breath sounds: Normal breath sounds.  Abdominal:     General: Abdomen is flat. Bowel sounds are normal. There is no distension.     Palpations: Abdomen is soft.  Genitourinary:    Rectum: Guaiac result negative.  Musculoskeletal:        General: Normal range of motion.     Cervical back: Normal range of motion and neck supple.  Skin:    General: Skin is warm.     Capillary Refill: Capillary refill takes less than 2 seconds.  Neurological:     General: No focal deficit present.     Mental Status: He is alert and oriented to person, place, and time.  Psychiatric:        Mood and Affect: Mood normal.        Behavior: Behavior normal.        Thought Content: Thought content normal.        Judgment: Judgment normal.        Assessment And Plan:    1. Health maintenance examination Behavior modifications discussed and diet history reviewed.   Pt will continue to exercise regularly and modify diet with low GI, plant based foods and decrease intake of processed foods.  Recommend intake of daily multivitamin, Vitamin D, and calcium.  Recommend mammogram and colonoscopy (referral was placed in December) for preventive screenings, as well as recommend immunizations that include influenza, TDAP, and Shingles  2. Essential hypertension B/P is well controlled.  CMP ordered to check renal function.  The importance of regular exercise and dietary modification was stressed to the patient.  Stressed importance of losing ten percent of her body weight to help with B/P control.  The weight loss would help with decreasing cardiac and cancer risk as well.  EKG done with SR with HR 57 - POCT Urinalysis Dipstick (81002) - POCT UA  - Microalbumin - EKG 12-Lead - CBC - CMP14+EGFR  3. Mixed hyperlipidemia Chronic, controlled Continue with current medications, tolerating medications well  - CBC - CMP14+EGFR - Lipid panel  4. Encounter for prostate cancer screening - PSA  5. BMI 60.0-69.9, adult [facility]) He has lost approximately 12 lbs since his last office visit Congratulated on this weight loss Encouraged to continue with healthy diet and regular exercise.   6. Abnormal glucose Chronic, controlled Continue with current medications Encouraged  to limit intake of sugary foods and drinks Encouraged to increase physical activity to 150 minutes per week - Hemoglobin A1c  7. CPAP (continuous positive airway pressure) dependence He is in need for another CPAP machine  8. Venous stasis ulcer of left calf with fat layer exposed without varicose veins (HCC) Has irritation to left lower extremity with erythema and yellow discoloration - mupirocin cream (BACTROBAN) 2 %; Apply to affected area 3 times daily  Dispense: 30 g; Refill: 2  9. Lower extremity edema He is to use lasix as needed for lower extremity edema for 3 days  Encouraged to increase physical activity as well and keep lower extremities elevated - furosemide (LASIX) 20 MG tablet; Take 1 tablet by mouth x 3 days as needed for leg swelling  Dispense: 30 tablet; Refill: 2    Patient was given opportunity to ask questions. Patient verbalized understanding of the plan and was able to repeat key elements of the plan. All questions were answered to their satisfaction.   Anthony Brine, FNP   I, Anthony Brine, FNP, have reviewed all documentation for this visit. The documentation on 01/23/21 for the exam, diagnosis, procedures, and orders are all accurate and complete.   THE PATIENT IS ENCOURAGED TO PRACTICE SOCIAL DISTANCING DUE TO THE COVID-19 PANDEMIC.

## 2021-01-23 NOTE — Patient Instructions (Addendum)
Health Maintenance, Male Adopting a healthy lifestyle and getting preventive care are important in promoting health and wellness. Ask your health care provider about: The right schedule for you to have regular tests and exams. Things you can do on your own to prevent diseases and keep yourself healthy. What should I know about diet, weight, and exercise? Eat a healthy diet  Eat a diet that includes plenty of vegetables, fruits, low-fat dairy products, and lean protein. Do not eat a lot of foods that are high in solid fats, added sugars, or sodium.  Maintain a healthy weight Body mass index (BMI) is a measurement that can be used to identify possible weight problems. It estimates body fat based on height and weight. Your health care provider can help determine your BMI and help you achieve or maintain ahealthy weight. Get regular exercise Get regular exercise. This is one of the most important things you can do for your health. Most adults should: Exercise for at least 150 minutes each week. The exercise should increase your heart rate and make you sweat (moderate-intensity exercise). Do strengthening exercises at least twice a week. This is in addition to the moderate-intensity exercise. Spend less time sitting. Even light physical activity can be beneficial. Watch cholesterol and blood lipids Have your blood tested for lipids and cholesterol at 60 years of age, then havethis test every 5 years. You may need to have your cholesterol levels checked more often if: Your lipid or cholesterol levels are high. You are older than 60 years of age. You are at high risk for heart disease. What should I know about cancer screening? Many types of cancers can be detected early and may often be prevented. Depending on your health history and family history, you may need to have cancer screening at various ages. This may include screening for: Colorectal cancer. Prostate cancer. Skin cancer. Lung  cancer. What should I know about heart disease, diabetes, and high blood pressure? Blood pressure and heart disease High blood pressure causes heart disease and increases the risk of stroke. This is more likely to develop in people who have high blood pressure readings, are of African descent, or are overweight. Talk with your health care provider about your target blood pressure readings. Have your blood pressure checked: Every 3-5 years if you are 18-39 years of age. Every year if you are 40 years old or older. If you are between the ages of 65 and 75 and are a current or former smoker, ask your health care provider if you should have a one-time screening for abdominal aortic aneurysm (AAA). Diabetes Have regular diabetes screenings. This checks your fasting blood sugar level. Have the screening done: Once every three years after age 45 if you are at a normal weight and have a low risk for diabetes. More often and at a younger age if you are overweight or have a high risk for diabetes. What should I know about preventing infection? Hepatitis B If you have a higher risk for hepatitis B, you should be screened for this virus. Talk with your health care provider to find out if you are at risk forhepatitis B infection. Hepatitis C Blood testing is recommended for: Everyone born from 1945 through 1965. Anyone with known risk factors for hepatitis C. Sexually transmitted infections (STIs) You should be screened each year for STIs, including gonorrhea and chlamydia, if: You are sexually active and are younger than 60 years of age. You are older than 60 years of age   and your health care provider tells you that you are at risk for this type of infection. Your sexual activity has changed since you were last screened, and you are at increased risk for chlamydia or gonorrhea. Ask your health care provider if you are at risk. Ask your health care provider about whether you are at high risk for HIV.  Your health care provider may recommend a prescription medicine to help prevent HIV infection. If you choose to take medicine to prevent HIV, you should first get tested for HIV. You should then be tested every 3 months for as long as you are taking the medicine. Follow these instructions at home: Lifestyle Do not use any products that contain nicotine or tobacco, such as cigarettes, e-cigarettes, and chewing tobacco. If you need help quitting, ask your health care provider. Do not use street drugs. Do not share needles. Ask your health care provider for help if you need support or information about quitting drugs. Alcohol use Do not drink alcohol if your health care provider tells you not to drink. If you drink alcohol: Limit how much you have to 0-2 drinks a day. Be aware of how much alcohol is in your drink. In the [place] one drink equals one 12 oz bottle of beer (355 mL), one 5 oz glass of wine (148 mL), or one 1 oz glass of hard liquor (44 mL). General instructions Schedule regular health, dental, and eye exams. Stay current with your vaccines. Tell your health care provider if: You often feel depressed. You have ever been abused or do not feel safe at home. Summary Adopting a healthy lifestyle and getting preventive care are important in promoting health and wellness. Follow your health care provider's instructions about healthy diet, exercising, and getting tested or screened for diseases. Follow your health care provider's instructions on monitoring your cholesterol and blood pressure. This information is not intended to replace advice given to you by your health care provider. Make sure you discuss any questions you have with your healthcare provider. Document Revised: 07/23/2018 Document Reviewed: 07/23/2018 Elsevier Patient Education  2022 Elsevier Inc.  

## 2021-01-24 LAB — CMP14+EGFR
ALT: 27 IU/L (ref 0–44)
AST: 35 IU/L (ref 0–40)
Albumin/Globulin Ratio: 1.3 (ref 1.2–2.2)
Albumin: 4 g/dL (ref 3.8–4.9)
Alkaline Phosphatase: 70 IU/L (ref 44–121)
BUN/Creatinine Ratio: 12 (ref 9–20)
BUN: 14 mg/dL (ref 6–24)
Bilirubin Total: 0.9 mg/dL (ref 0.0–1.2)
CO2: 22 mmol/L (ref 20–29)
Calcium: 8.5 mg/dL — ABNORMAL LOW (ref 8.7–10.2)
Chloride: 102 mmol/L (ref 96–106)
Creatinine, Ser: 1.17 mg/dL (ref 0.76–1.27)
Globulin, Total: 3.2 g/dL (ref 1.5–4.5)
Glucose: 119 mg/dL — ABNORMAL HIGH (ref 65–99)
Potassium: 4.1 mmol/L (ref 3.5–5.2)
Sodium: 140 mmol/L (ref 134–144)
Total Protein: 7.2 g/dL (ref 6.0–8.5)
eGFR: 72 mL/min/{1.73_m2} (ref >59)

## 2021-01-24 LAB — LIPID PANEL
Chol/HDL Ratio: 4.4 ratio (ref 0.0–5.0)
Cholesterol, Total: 120 mg/dL (ref 100–199)
HDL: 27 mg/dL — ABNORMAL LOW (ref >39)
LDL Chol Calc (NIH): 69 mg/dL (ref 0–99)
Triglycerides: 137 mg/dL (ref 0–149)
VLDL Cholesterol Cal: 24 mg/dL (ref 5–40)

## 2021-01-24 LAB — CBC
Hematocrit: 40.1 % (ref 37.5–51.0)
Hemoglobin: 12.3 g/dL — ABNORMAL LOW (ref 13.0–17.7)
MCH: 26.9 pg (ref 26.6–33.0)
MCHC: 30.7 g/dL — ABNORMAL LOW (ref 31.5–35.7)
MCV: 88 fL (ref 79–97)
Platelets: 215 10*3/uL (ref 150–450)
RBC: 4.58 x10E6/uL (ref 4.14–5.80)
RDW: 17.1 % — ABNORMAL HIGH (ref 11.6–15.4)
WBC: 5.3 10*3/uL (ref 3.4–10.8)

## 2021-01-24 LAB — PSA: Prostate Specific Ag, Serum: 1.3 ng/mL (ref 0.0–4.0)

## 2021-01-24 LAB — HEMOGLOBIN A1C
Est. average glucose Bld gHb Est-mCnc: 123 mg/dL
Hgb A1c MFr Bld: 5.9 % — ABNORMAL HIGH (ref 4.8–5.6)

## 2021-02-20 ENCOUNTER — Ambulatory Visit (INDEPENDENT_AMBULATORY_CARE_PROVIDER_SITE_OTHER): Payer: 59 | Admitting: Nurse Practitioner

## 2021-02-20 ENCOUNTER — Other Ambulatory Visit: Payer: Self-pay

## 2021-02-20 ENCOUNTER — Encounter: Payer: Self-pay | Admitting: Nurse Practitioner

## 2021-02-20 VITALS — BP 132/68 | HR 60 | Temp 97.9°F | Ht 68.6 in | Wt >= 6400 oz

## 2021-02-20 DIAGNOSIS — L97222 Non-pressure chronic ulcer of left calf with fat layer exposed: Secondary | ICD-10-CM | POA: Diagnosis not present

## 2021-02-20 DIAGNOSIS — I872 Venous insufficiency (chronic) (peripheral): Secondary | ICD-10-CM

## 2021-02-20 DIAGNOSIS — R600 Localized edema: Secondary | ICD-10-CM

## 2021-02-20 DIAGNOSIS — Z6841 Body Mass Index (BMI) 40.0 and over, adult: Secondary | ICD-10-CM

## 2021-02-20 DIAGNOSIS — Z9989 Dependence on other enabling machines and devices: Secondary | ICD-10-CM | POA: Diagnosis not present

## 2021-02-20 NOTE — Patient Instructions (Signed)
You can take the Rx for the TED hose to [facility] or [facility] to have filled.

## 2021-02-20 NOTE — Progress Notes (Signed)
I,Yamilka Roman [employer] as a Neurosurgeon for [employer], FNP.,have documented all relevant documentation on the behalf of Arnette Felts, FNP,as directed by  Arnette Felts, FNP while in the presence of Arnette Felts, FNP.  This visit occurred during the SARS-CoV-2 public health emergency.  Safety protocols were in place, including screening questions prior to the visit, additional usage of staff PPE, and extensive cleaning of exam room while observing appropriate contact time as indicated for disinfecting solutions.  Subjective:     Patient ID: Anthony Fitzpatrick , male    DOB: [birth year] , 60 y.o.   MRN: [id number]   Chief Complaint  Patient presents with   Wound Check    HPI  Patient presents today for a wound check  Wt Readings from Last 3 Encounters: 02/20/21 : (!) 411 lb (186.4 kg) 01/23/21 : (!) 412 lb 3.2 oz (187 kg) 07/26/20 : (!) 423 lb 3.2 oz (192 kg)    Wound Check    Past Medical History:  Diagnosis Date   High cholesterol    Hypertension    Kidney stones    Morbid obesity (HCC)    BMI 55   OSA on CPAP      Family History  Problem Relation Age of Onset   Hypertension Father    Kidney disease Father    Hypertension Other    Cancer Sister      Current Outpatient Medications:    aspirin 81 MG chewable tablet, Chew 81 mg by mouth daily., Disp: , Rfl:    furosemide (LASIX) 20 MG tablet, Take 1 tablet by mouth x 3 days as needed for leg swelling, Disp: 30 tablet, Rfl: 2   mupirocin cream (BACTROBAN) 2 %, Apply to affected area 3 times daily, Disp: 30 g, Rfl: 2   rosuvastatin (CRESTOR) 20 MG tablet, TAKE 1 TABLET BY MOUTH EVERY DAY, Disp: 90 tablet, Rfl: 1   valsartan-hydrochlorothiazide (DIOVAN-HCT) 320-25 MG tablet, TAKE 1 TABLET BY MOUTH EVERY DAY, Disp: 90 tablet, Rfl: 1   No Known Allergies   Review of Systems  Constitutional: Negative.   Eyes: Negative.   Respiratory:  Negative for shortness of breath, wheezing and stridor.   Cardiovascular: Negative.    Gastrointestinal: Negative.   Skin:  Positive for wound (left lower extremity).  Neurological:  Negative for dizziness and headaches.  Psychiatric/Behavioral: Negative.      Today's Vitals   02/20/21 0834  BP: 132/68  Pulse: 60  Temp: 97.9 F (36.6 C)  Weight: (!) 411 lb (186.4 kg)  Height: 5' 8.6" (1.742 m)  PainSc: 0-No pain   Body mass index is 61.4 kg/m.   Objective:  Physical Exam Musculoskeletal:        General: Swelling (left lower extremity worse than right) present.  Skin:    Findings: Erythema (left worse than right lower extremity) present.     Comments: left posterior calf has 2 scabbed areas and one on the front with slight yellow drainage  Neurological:     General: No focal deficit present.     Mental Status: He is oriented to person, place, and time.     Cranial Nerves: No cranial nerve deficit.     Motor: No weakness.  Psychiatric:        Mood and Affect: Mood normal.        Behavior: Behavior normal.        Thought Content: Thought content normal.        Judgment: Judgment normal.  Assessment And Plan:     1. Venous stasis ulcer of left calf with fat layer exposed without varicose veins (HCC) He was advised by Dr. Myra Gianotti in 2019 to wear compression socks to prevent venous ulcers. He was diagnosed with venous insufficiency. He is advised to get compression socks/TED hose with a Rx given to him  - Ambulatory referral to Physical Therapy  2. BMI 60.0-69.9, adult (HCC) Chronic Discussed healthy diet and regular exercise options  Encouraged to exercise at least 150 minutes per week with 2 days of strength training  3. Lower extremity edema Will refer to PT for lymphedema Lower extremities are firm to touch - Ambulatory referral to Physical Therapy  4. CPAP (continuous positive airway pressure) dependence He has not had a sleep study in more than 10 years Will refer for a sleep study as he is needing a new machine He is planning to call  [facility]  - Ambulatory referral to Sleep Studies    Patient was given opportunity to ask questions. Patient verbalized understanding of the plan and was able to repeat key elements of the plan. All questions were answered to their satisfaction.  Arnette Felts, FNP   I, Arnette Felts, FNP, have reviewed all documentation for this visit. The documentation on 02/20/21 for the exam, diagnosis, procedures, and orders are all accurate and complete.   IF YOU HAVE BEEN REFERRED TO A SPECIALIST, IT MAY TAKE 1-2 WEEKS TO SCHEDULE/PROCESS THE REFERRAL. IF YOU HAVE NOT HEARD FROM US/SPECIALIST IN TWO WEEKS, PLEASE GIVE [place] A CALL AT [phone].   THE PATIENT IS ENCOURAGED TO PRACTICE SOCIAL DISTANCING DUE TO THE COVID-19 PANDEMIC.

## 2021-07-19 ENCOUNTER — Other Ambulatory Visit: Payer: Self-pay | Admitting: Nurse Practitioner

## 2021-07-19 DIAGNOSIS — E782 Mixed hyperlipidemia: Secondary | ICD-10-CM

## 2021-07-19 DIAGNOSIS — I10 Essential (primary) hypertension: Secondary | ICD-10-CM

## 2021-07-31 ENCOUNTER — Ambulatory Visit (INDEPENDENT_AMBULATORY_CARE_PROVIDER_SITE_OTHER): Payer: 59 | Admitting: Nurse Practitioner

## 2021-07-31 ENCOUNTER — Encounter: Payer: Self-pay | Admitting: Nurse Practitioner

## 2021-07-31 ENCOUNTER — Other Ambulatory Visit: Payer: Self-pay

## 2021-07-31 VITALS — BP 130/72 | HR 57 | Temp 98.9°F | Ht 68.6 in | Wt >= 6400 oz

## 2021-07-31 DIAGNOSIS — F4321 Adjustment disorder with depressed mood: Secondary | ICD-10-CM

## 2021-07-31 DIAGNOSIS — Z6841 Body Mass Index (BMI) 40.0 and over, adult: Secondary | ICD-10-CM

## 2021-07-31 DIAGNOSIS — I10 Essential (primary) hypertension: Secondary | ICD-10-CM | POA: Diagnosis not present

## 2021-07-31 DIAGNOSIS — E782 Mixed hyperlipidemia: Secondary | ICD-10-CM | POA: Diagnosis not present

## 2021-07-31 DIAGNOSIS — Z23 Encounter for immunization: Secondary | ICD-10-CM | POA: Diagnosis not present

## 2021-07-31 DIAGNOSIS — R195 Other fecal abnormalities: Secondary | ICD-10-CM

## 2021-07-31 DIAGNOSIS — R7309 Other abnormal glucose: Secondary | ICD-10-CM | POA: Diagnosis not present

## 2021-07-31 DIAGNOSIS — Z1211 Encounter for screening for malignant neoplasm of colon: Secondary | ICD-10-CM

## 2021-07-31 DIAGNOSIS — Z9989 Dependence on other enabling machines and devices: Secondary | ICD-10-CM

## 2021-07-31 NOTE — Patient Instructions (Signed)

## 2021-07-31 NOTE — Progress Notes (Signed)
°I,[name],acting as a scribe for Janece Moore, FNP.,have documented all relevant documentation on the behalf of Janece Moore, FNP,as directed by  Janece Moore, FNP while in the presence of Janece Moore, FNP. ° °This visit occurred during the SARS-CoV-2 public health emergency.  Safety protocols were in place, including screening questions prior to the visit, additional usage of staff PPE, and extensive cleaning of exam room while observing appropriate contact time as indicated for disinfecting solutions. ° °Subjective:  °  ° Patient ID: Anthony Fitzpatrick , male    DOB: 03/28/1961 , 60 y.o.   MRN: [id number] ° ° °Chief Complaint  °Patient presents with  ° Hypertension  ° ° °HPI ° °Patient here for a f/u on his blood pressure and cholesterol.  ° °Hypertension °This is a chronic problem. The current episode started more than 1 year ago. The problem is unchanged. The problem is controlled. Pertinent negatives include no anxiety, chest pain, headaches or palpitations. There are no associated agents to hypertension. Risk factors for coronary artery disease include obesity and sedentary lifestyle. Past treatments include diuretics and angiotensin blockers. The current treatment provides significant improvement. There are no compliance problems.  There is no history of angina. There is no history of chronic renal disease.   ° °Past Medical History:  °Diagnosis Date  ° High cholesterol   ° Hypertension   ° Kidney stones   ° Morbid obesity (HCC)   ° BMI 55  ° OSA on CPAP   °  ° °Family History  °Problem Relation Age of Onset  ° Hypertension Father   ° Kidney disease Father   ° Hypertension Other   ° Cancer Sister   ° ° ° °Current Outpatient Medications:  °  aspirin 81 MG chewable tablet, Chew 81 mg by mouth daily., Disp: , Rfl:  °  furosemide (LASIX) 20 MG tablet, Take 1 tablet by mouth x 3 days as needed for leg swelling, Disp: 30 tablet, Rfl: 2 °  mupirocin cream (BACTROBAN) 2 %, Apply to affected area 3 times daily,  Disp: 30 g, Rfl: 2 °  rosuvastatin (CRESTOR) 20 MG tablet, TAKE 1 TABLET BY MOUTH EVERY DAY, Disp: 90 tablet, Rfl: 1 °  valsartan-hydrochlorothiazide (DIOVAN-HCT) 320-25 MG tablet, TAKE 1 TABLET BY MOUTH EVERY DAY, Disp: 90 tablet, Rfl: 1  ° °No Known Allergies  ° °Review of Systems  °Constitutional: Negative.   °Respiratory: Negative.    °Cardiovascular: Negative.  Negative for chest pain and palpitations.  °Gastrointestinal: Negative.   °Neurological: Negative.  Negative for headaches.   ° °Today's Vitals  ° 07/31/21 0940  °BP: 130/72  °Pulse: (!) 57  °Temp: 98.9 °F (37.2 °C)  °TempSrc: Oral  °Weight: (!) 415 lb (188.2 kg)  °Height: 5' 8.6" (1.742 m)  ° °Body mass index is 62 kg/m².  °Wt Readings from Last 3 Encounters:  °07/31/21 (!) 415 lb (188.2 kg)  °02/20/21 (!) 411 lb (186.4 kg)  °01/23/21 (!) 412 lb 3.2 oz (187 kg)  ° ° °Objective:  °Physical Exam °Vitals reviewed.  °Constitutional:   °   Appearance: Normal appearance. He is obese.  °   Comments: Morbid obesity  °HENT:  °   Head: Normocephalic and atraumatic.  °Eyes:  °   Extraocular Movements: Extraocular movements intact.  °   Pupils: Pupils are equal, round, and reactive to light.  °Cardiovascular:  °   Rate and Rhythm: Normal rate and regular rhythm.  °   Pulses: Normal pulses.  °   Heart   Heart sounds: Normal heart sounds. No murmur heard. Pulmonary:     Effort: Pulmonary effort is normal. No respiratory distress.     Breath sounds: Normal breath sounds.  Abdominal:     General: Abdomen is flat. Bowel sounds are normal. There is no distension.     Palpations: Abdomen is soft.  Musculoskeletal:        General: Normal range of motion.     Cervical back: Normal range of motion and neck supple.  Skin:    General: Skin is warm.     Capillary Refill: Capillary refill takes less than 2 seconds.  Neurological:     General: No focal deficit present.     Mental Status: He is alert and oriented to person, place, and time.  Psychiatric:        Mood and  Affect: Mood normal.        Behavior: Behavior normal.        Thought Content: Thought content normal.        Judgment: Judgment normal.        Assessment And Plan:     1. Essential hypertension Comments: Blood pressure is controlled, continue current medications, if he has a low blood pressure he is to call to office. Stay well hydrated with water - BMP8+EGFR  2. Abnormal glucose Comments: Stable, diet controlled. He plans to start back exercising.  - Hemoglobin A1c  3. Mixed hyperlipidemia Comments: Stable, continue current medications. Tolerating medications well.  - Lipid panel  4. Grief Comments: His mother passed in July 2022, reports he has been going to grief counseling with hospice.   5. Need for influenza vaccination - Flu Vaccine QUAD 6+ mos PF IM (Fluarix Quad PF)  6. BMI 60.0-69.9, adult ([place])  7. Colon cancer screening Comments: I am referring him back to GI for colonoscopy had positive hemocult - Ambulatory referral to Gastroenterology  8. Morbid obesity ([place]) Chronic Discussed healthy diet and regular exercise options  Encouraged to exercise at least 150 minutes per week with 2 days of strength training  9. CPAP (continuous positive airway pressure) dependence Doing well with CPAP, reports benefits with quality of life.   10. Heme positive stool - Ambulatory referral to Gastroenterology  He is encouraged to initially strive for BMI less than 30 to decrease cardiac risk. He is advised to exercise no less than 150 minutes per week.     Patient was given opportunity to ask questions. Patient verbalized understanding of the plan and was able to repeat key elements of the plan. All questions were answered to their satisfaction.  [name], FNP   I, [name], FNP, have reviewed all documentation for this visit. The documentation on 07/31/21 for the exam, diagnosis, procedures, and orders are all accurate and complete.   IF YOU HAVE BEEN REFERRED TO  A SPECIALIST, IT MAY TAKE 1-2 WEEKS TO SCHEDULE/PROCESS THE REFERRAL. IF YOU HAVE NOT HEARD FROM US/SPECIALIST IN TWO WEEKS, PLEASE GIVE [place] A CALL AT [phone].   THE PATIENT IS ENCOURAGED TO PRACTICE SOCIAL DISTANCING DUE TO THE COVID-19 PANDEMIC.

## 2021-08-01 ENCOUNTER — Other Ambulatory Visit: Payer: Self-pay | Admitting: Nurse Practitioner

## 2021-08-01 DIAGNOSIS — R7303 Prediabetes: Secondary | ICD-10-CM

## 2021-08-01 LAB — HEMOGLOBIN A1C
Est. average glucose Bld gHb Est-mCnc: 126 mg/dL
Hgb A1c MFr Bld: 6 % — ABNORMAL HIGH (ref 4.8–5.6)

## 2021-08-01 LAB — BMP8+EGFR
BUN/Creatinine Ratio: 13 (ref 10–24)
BUN: 14 mg/dL (ref 8–27)
CO2: 24 mmol/L (ref 20–29)
Calcium: 9.1 mg/dL (ref 8.6–10.2)
Chloride: 103 mmol/L (ref 96–106)
Creatinine, Ser: 1.05 mg/dL (ref 0.76–1.27)
Glucose: 127 mg/dL — ABNORMAL HIGH (ref 70–99)
Potassium: 4.7 mmol/L (ref 3.5–5.2)
Sodium: 144 mmol/L (ref 134–144)
eGFR: 81 mL/min/{1.73_m2} (ref >59)

## 2021-08-01 LAB — LIPID PANEL
Chol/HDL Ratio: 5.6 ratio — ABNORMAL HIGH (ref 0.0–5.0)
Cholesterol, Total: 158 mg/dL (ref 100–199)
HDL: 28 mg/dL — ABNORMAL LOW (ref >39)
LDL Chol Calc (NIH): 95 mg/dL (ref 0–99)
Triglycerides: 201 mg/dL — ABNORMAL HIGH (ref 0–149)
VLDL Cholesterol Cal: 35 mg/dL (ref 5–40)

## 2021-08-01 MED ORDER — METFORMIN HCL 500 MG PO TABS: 500.0000 mg | ORAL_TABLET | Freq: Two times a day (BID) | ORAL | 2 refills | Status: DC

## 2021-08-01 MED ORDER — METFORMIN HCL 500 MG PO TABS: 500.0000 mg | ORAL_TABLET | Freq: Two times a day (BID) | ORAL | 11 refills | Status: DC

## 2021-08-03 ENCOUNTER — Other Ambulatory Visit: Payer: Self-pay

## 2021-08-03 DIAGNOSIS — R7303 Prediabetes: Secondary | ICD-10-CM

## 2021-08-03 MED ORDER — METFORMIN HCL 500 MG PO TABS: 500.0000 mg | ORAL_TABLET | Freq: Two times a day (BID) | ORAL | 2 refills | Status: DC

## 2021-08-15 ENCOUNTER — Ambulatory Visit (INDEPENDENT_AMBULATORY_CARE_PROVIDER_SITE_OTHER): Payer: 59

## 2021-08-15 ENCOUNTER — Other Ambulatory Visit: Payer: Self-pay

## 2021-08-15 VITALS — BP 138/80 | HR 75 | Temp 97.8°F | Ht 68.0 in | Wt >= 6400 oz

## 2021-08-15 DIAGNOSIS — Z23 Encounter for immunization: Secondary | ICD-10-CM

## 2021-08-15 NOTE — Progress Notes (Signed)
Pt here today to receive first dose of shingrix.

## 2021-09-18 ENCOUNTER — Other Ambulatory Visit: Payer: Self-pay

## 2021-09-18 ENCOUNTER — Ambulatory Visit (INDEPENDENT_AMBULATORY_CARE_PROVIDER_SITE_OTHER): Payer: 59 | Admitting: Nurse Practitioner

## 2021-09-18 VITALS — BP 112/70 | HR 66 | Temp 97.9°F | Ht 68.0 in | Wt >= 6400 oz

## 2021-09-18 DIAGNOSIS — I10 Essential (primary) hypertension: Secondary | ICD-10-CM | POA: Diagnosis not present

## 2021-09-18 DIAGNOSIS — R7309 Other abnormal glucose: Secondary | ICD-10-CM

## 2021-09-18 DIAGNOSIS — Z6841 Body Mass Index (BMI) 40.0 and over, adult: Secondary | ICD-10-CM

## 2021-09-18 MED ORDER — VALSARTAN-HYDROCHLOROTHIAZIDE 320-12.5 MG PO TABS: 1.0000 | ORAL_TABLET | Freq: Every day | ORAL | 1 refills | Status: DC

## 2021-09-18 NOTE — Patient Instructions (Signed)
Prediabetes Eating Plan °Prediabetes is a condition that causes blood sugar (glucose) levels to be higher than normal. This increases the risk for developing type 2 diabetes (type 2 diabetes mellitus). Working with a health care provider or nutrition specialist (dietitian) to make diet and lifestyle changes can help prevent the onset of diabetes. These changes may help you: °Control your blood glucose levels. °Improve your cholesterol levels. °Manage your blood pressure. °What are tips for following this plan? °Reading food labels °Read food labels to check the amount of fat, salt (sodium), and sugar in prepackaged foods. Avoid foods that have: °Saturated fats. °Trans fats. °Added sugars. °Avoid foods that have more than 300 milligrams (mg) of sodium per serving. Limit your sodium intake to less than 2,300 mg each day. °Shopping °Avoid buying pre-made and processed foods. °Avoid buying drinks with added sugar. °Cooking °Cook with olive oil. Do not use butter, lard, or ghee. °Bake, broil, grill, steam, or boil foods. Avoid frying. °Meal planning ° °Work with your dietitian to create an eating plan that is right for you. This may include tracking how many calories you take in each day. Use a food diary, notebook, or mobile application to track what you eat at each meal. °Consider following a Mediterranean diet. This includes: °Eating several servings of fresh fruits and vegetables each day. °Eating fish at least twice a week. °Eating one serving each day of whole grains, beans, nuts, and seeds. °Using olive oil instead of other fats. °Limiting alcohol. °Limiting red meat. °Using nonfat or low-fat dairy products. °Consider following a plant-based diet. This includes dietary choices that focus on eating mostly vegetables and fruit, grains, beans, nuts, and seeds. °If you have high blood pressure, you may need to limit your sodium intake or follow a diet such as the DASH (Dietary Approaches to Stop Hypertension) eating  plan. The DASH diet aims to lower high blood pressure. °Lifestyle °Set weight loss goals with help from your health care team. It is recommended that most people with prediabetes lose 7% of their body weight. °Exercise for at least 30 minutes 5 or more days a week. °Attend a support group or seek support from a mental health counselor. °Take over-the-counter and prescription medicines only as told by your health care provider. °What foods are recommended? °Fruits °Berries. Bananas. Apples. Oranges. Grapes. Papaya. Mango. Pomegranate. Kiwi. Grapefruit. Cherries. °Vegetables °Lettuce. Spinach. Peas. Beets. Cauliflower. Cabbage. Broccoli. Carrots. Tomatoes. Squash. Eggplant. Herbs. Peppers. Onions. Cucumbers. Brussels sprouts. °Grains °Whole grains, such as whole-wheat or whole-grain breads, crackers, cereals, and pasta. Unsweetened oatmeal. Bulgur. Barley. Quinoa. Brown rice. Corn or whole-wheat flour tortillas or taco shells. °Meats and other proteins °Seafood. Poultry without skin. Lean cuts of pork and beef. Tofu. Eggs. Nuts. Beans. °Dairy °Low-fat or fat-free dairy products, such as yogurt, cottage cheese, and cheese. °Beverages °Water. Tea. Coffee. Sugar-free or diet soda. Seltzer water. Low-fat or nonfat milk. Milk alternatives, such as soy or almond milk. °Fats and oils °Olive oil. Canola oil. Sunflower oil. Grapeseed oil. Avocado. Walnuts. °Sweets and desserts °Sugar-free or low-fat pudding. Sugar-free or low-fat ice cream and other frozen treats. °Seasonings and condiments °Herbs. Sodium-free spices. Mustard. Relish. Low-salt, low-sugar ketchup. Low-salt, low-sugar barbecue sauce. Low-fat or fat-free mayonnaise. °The items listed above may not be a complete list of recommended foods and beverages. Contact a dietitian for more information. °What foods are not recommended? °Fruits °Fruits canned with syrup. °Vegetables °Canned vegetables. Frozen vegetables with butter or cream sauce. °Grains °Refined white  flour and flour   products, such as bread, pasta, snack foods, and cereals. °Meats and other proteins °Fatty cuts of meat. Poultry with skin. Breaded or fried meat. Processed meats. °Dairy °Full-fat yogurt, cheese, or milk. °Beverages °Sweetened drinks, such as iced tea and soda. °Fats and oils °Butter. Lard. Ghee. °Sweets and desserts °Baked goods, such as cake, cupcakes, pastries, cookies, and cheesecake. °Seasonings and condiments °Spice mixes with added salt. Ketchup. Barbecue sauce. Mayonnaise. °The items listed above may not be a complete list of foods and beverages that are not recommended. Contact a dietitian for more information. °Where to find more information °American Diabetes Association: www.diabetes.org °Summary °You may need to make diet and lifestyle changes to help prevent the onset of diabetes. These changes can help you control blood sugar, improve cholesterol levels, and manage blood pressure. °Set weight loss goals with help from your health care team. It is recommended that most people with prediabetes lose 7% of their body weight. °Consider following a Mediterranean diet. This includes eating plenty of fresh fruits and vegetables, whole grains, beans, nuts, seeds, fish, and low-fat dairy, and using olive oil instead of other fats. °This information is not intended to replace advice given to you by your health care provider. Make sure you discuss any questions you have with your health care provider. °Document Revised: 10/29/2019 Document Reviewed: 10/29/2019 °Elsevier Patient Education © 2022 [employer] Inc. ° °

## 2021-09-18 NOTE — Progress Notes (Signed)
I,[name],acting as a Neurosurgeon for [employer], FNP.,have documented all relevant documentation on the behalf of Arnette Felts, FNP,as directed by  Arnette Felts, FNP while in the presence of Arnette Felts, FNP.  This visit occurred during the SARS-CoV-2 public health emergency.  Safety protocols were in place, including screening questions prior to the visit, additional usage of staff PPE, and extensive cleaning of exam room while observing appropriate contact time as indicated for disinfecting solutions.  Subjective:     Patient ID: Anthony Fitzpatrick , male    DOB: 1960-12-23 , 61 y.o.   MRN: [id number]   Chief Complaint  Patient presents with   Prediabetes    HPI  Patient here for a f/u on his prediabetes medication follow up. He was started on metformin in December. He is tolerating metformin well. He is trying to take once a day but will forget the second one often.  He is trying to cut back on tea and drinking more water. He is having low blood pressure in the morning 85/40.   Wt Readings from Last 3 Encounters: 09/18/21 : (!) 416 lb (188.7 kg) 08/15/21 : (!) 417 lb 3.2 oz (189.2 kg) 07/31/21 : (!) 415 lb (188.2 kg)  No change in appetite.     Past Medical History:  Diagnosis Date   High cholesterol    Hypertension    Kidney stones    Morbid obesity (HCC)    BMI 55   OSA on CPAP      Family History  Problem Relation Age of Onset   Hypertension Father    Kidney disease Father    Hypertension Other    Cancer Sister      Current Outpatient Medications:    aspirin 81 MG chewable tablet, Chew 81 mg by mouth daily., Disp: , Rfl:    furosemide (LASIX) 20 MG tablet, Take 1 tablet by mouth x 3 days as needed for leg swelling, Disp: 30 tablet, Rfl: 2   metFORMIN (GLUCOPHAGE) 500 MG tablet, Take 1 tablet (500 mg total) by mouth 2 (two) times daily with a meal., Disp: 60 tablet, Rfl: 2   mupirocin cream (BACTROBAN) 2 %, Apply to affected area 3 times daily, Disp: 30 g, Rfl:  2   rosuvastatin (CRESTOR) 20 MG tablet, TAKE 1 TABLET BY MOUTH EVERY DAY, Disp: 90 tablet, Rfl: 1   valsartan-hydrochlorothiazide (DIOVAN-HCT) 320-12.5 MG tablet, Take 1 tablet by mouth daily., Disp: 90 tablet, Rfl: 1   No Known Allergies   Review of Systems  Constitutional: Negative.   Respiratory: Negative.    Cardiovascular: Negative.   Gastrointestinal: Negative.   Neurological: Negative.   Psychiatric/Behavioral: Negative.      Today's Vitals   09/18/21 1545  BP: 112/70  Pulse: 66  Temp: 97.9 F (36.6 C)  TempSrc: Oral  Weight: (!) 416 lb (188.7 kg)  Height: 5\' 8"  (1.727 m)   Body mass index is 63.25 kg/m.  Wt Readings from Last 3 Encounters:  09/18/21 (!) 416 lb (188.7 kg)  08/15/21 (!) 417 lb 3.2 oz (189.2 kg)  07/31/21 (!) 415 lb (188.2 kg)    Objective:  Physical Exam Vitals reviewed.  Constitutional:      General: He is not in acute distress.    Appearance: Normal appearance.     Comments: Morbid obesity  HENT:     Head: Normocephalic and atraumatic.  Eyes:     Extraocular Movements: Extraocular movements intact.     Pupils: Pupils are equal, round, and  reactive to light.  Cardiovascular:     Rate and Rhythm: Normal rate and regular rhythm.     Pulses: Normal pulses.     Heart sounds: Normal heart sounds. No murmur heard. Pulmonary:     Effort: Pulmonary effort is normal. No respiratory distress.     Breath sounds: Normal breath sounds.  Abdominal:     General: Abdomen is flat. Bowel sounds are normal. There is no distension.     Palpations: Abdomen is soft.  Musculoskeletal:        General: Normal range of motion.  Skin:    General: Skin is warm.     Capillary Refill: Capillary refill takes less than 2 seconds.     Comments: Dark pigmented skin lower extremities  Neurological:     General: No focal deficit present.     Mental Status: He is alert and oriented to person, place, and time.     Cranial Nerves: No cranial nerve deficit.     Motor:  No weakness.  Psychiatric:        Mood and Affect: Mood normal.        Behavior: Behavior normal.        Thought Content: Thought content normal.        Judgment: Judgment normal.        Assessment And Plan:     1. Essential hypertension Comments: Blood pressure is well controlled. Continue current medications  - valsartan-hydrochlorothiazide (DIOVAN-HCT) 320-12.5 MG tablet; Take 1 tablet by mouth daily.  Dispense: 90 tablet; Refill: 1  2. Abnormal glucose Comments: He is doing well with metformin, continue current medications. No labs this visit. Encouraged to exercise at least 150 minutes week and eat healthy diet  3. Morbid obesity (HCC) He is encouraged to initially strive for BMI less than 30 to decrease cardiac risk. He is advised to exercise no less than 150 minutes per week.   4. BMI 60.0-69.9, adult [facility])     Patient was given opportunity to ask questions. Patient verbalized understanding of the plan and was able to repeat key elements of the plan. All questions were answered to their satisfaction.  Arnette Felts, FNP    I, Arnette Felts, FNP, have reviewed all documentation for this visit. The documentation on 09/18/21 for the exam, diagnosis, procedures, and orders are all accurate and complete.  IF YOU HAVE BEEN REFERRED TO A SPECIALIST, IT MAY TAKE 1-2 WEEKS TO SCHEDULE/PROCESS THE REFERRAL. IF YOU HAVE NOT HEARD FROM US/SPECIALIST IN TWO WEEKS, PLEASE GIVE [place] A CALL AT [phone].   THE PATIENT IS ENCOURAGED TO PRACTICE SOCIAL DISTANCING DUE TO THE COVID-19 PANDEMIC.

## 2021-10-15 ENCOUNTER — Encounter: Payer: Self-pay | Admitting: Nurse Practitioner

## 2021-11-14 ENCOUNTER — Ambulatory Visit (INDEPENDENT_AMBULATORY_CARE_PROVIDER_SITE_OTHER): Payer: 59

## 2021-11-14 VITALS — BP 122/84 | HR 61 | Temp 98.1°F | Ht 68.0 in | Wt >= 6400 oz

## 2021-11-14 DIAGNOSIS — Z23 Encounter for immunization: Secondary | ICD-10-CM

## 2021-11-14 NOTE — Progress Notes (Signed)
Pt presents today for second shingrix.  

## 2021-12-09 ENCOUNTER — Other Ambulatory Visit: Payer: Self-pay | Admitting: Nurse Practitioner

## 2021-12-09 DIAGNOSIS — E782 Mixed hyperlipidemia: Secondary | ICD-10-CM

## 2022-01-29 ENCOUNTER — Encounter: Payer: 59 | Admitting: Nurse Practitioner

## 2022-01-31 ENCOUNTER — Encounter: Payer: 59 | Admitting: Nurse Practitioner

## 2022-03-19 ENCOUNTER — Other Ambulatory Visit: Payer: Self-pay | Admitting: Nurse Practitioner

## 2022-03-19 DIAGNOSIS — I10 Essential (primary) hypertension: Secondary | ICD-10-CM

## 2022-05-11 ENCOUNTER — Encounter: Payer: Self-pay | Admitting: Nurse Practitioner

## 2022-05-16 ENCOUNTER — Encounter: Payer: Self-pay | Admitting: Nurse Practitioner

## 2022-05-16 ENCOUNTER — Ambulatory Visit (INDEPENDENT_AMBULATORY_CARE_PROVIDER_SITE_OTHER): Payer: 59 | Admitting: Nurse Practitioner

## 2022-05-16 VITALS — BP 140/60 | HR 64 | Temp 97.7°F | Ht 68.0 in | Wt >= 6400 oz

## 2022-05-16 DIAGNOSIS — Z1211 Encounter for screening for malignant neoplasm of colon: Secondary | ICD-10-CM

## 2022-05-16 DIAGNOSIS — I10 Essential (primary) hypertension: Secondary | ICD-10-CM | POA: Diagnosis not present

## 2022-05-16 DIAGNOSIS — Z23 Encounter for immunization: Secondary | ICD-10-CM

## 2022-05-16 DIAGNOSIS — R7309 Other abnormal glucose: Secondary | ICD-10-CM | POA: Diagnosis not present

## 2022-05-16 DIAGNOSIS — E782 Mixed hyperlipidemia: Secondary | ICD-10-CM

## 2022-05-16 DIAGNOSIS — R600 Localized edema: Secondary | ICD-10-CM

## 2022-05-16 DIAGNOSIS — Z6841 Body Mass Index (BMI) 40.0 and over, adult: Secondary | ICD-10-CM

## 2022-05-16 MED ORDER — VALSARTAN-HYDROCHLOROTHIAZIDE 320-12.5 MG PO TABS: 1.0000 | ORAL_TABLET | Freq: Every day | ORAL | 1 refills | Status: DC

## 2022-05-16 MED ORDER — FUROSEMIDE 20 MG PO TABS: ORAL_TABLET | ORAL | 2 refills | Status: DC

## 2022-05-16 NOTE — Patient Instructions (Addendum)
Hypertension, Adult High blood pressure (hypertension) is when the force of blood pumping through the arteries is too strong. The arteries are the blood vessels that carry blood from the heart throughout the body. Hypertension forces the heart to work harder to pump blood and may cause arteries to become narrow or stiff. Untreated or uncontrolled hypertension can lead to a heart attack, heart failure, a stroke, kidney disease, and other problems. A blood pressure reading consists of a higher number over a lower number. Ideally, your blood pressure should be below 120/80. The first ("top") number is called the systolic pressure. It is a measure of the pressure in your arteries as your heart beats. The second ("bottom") number is called the diastolic pressure. It is a measure of the pressure in your arteries as the heart relaxes. What are the causes? The exact cause of this condition is not known. There are some conditions that result in high blood pressure. What increases the risk? Certain factors may make you more likely to develop high blood pressure. Some of these risk factors are under your control, including: Smoking. Not getting enough exercise or physical activity. Being overweight. Having too much fat, sugar, calories, or salt (sodium) in your diet. Drinking too much alcohol. Other risk factors include: Having a personal history of heart disease, diabetes, high cholesterol, or kidney disease. Stress. Having a family history of high blood pressure and high cholesterol. Having obstructive sleep apnea. Age. The risk increases with age. What are the signs or symptoms? High blood pressure may not cause symptoms. Very high blood pressure (hypertensive crisis) may cause: Headache. Fast or irregular heartbeats (palpitations). Shortness of breath. Nosebleed. Nausea and vomiting. Vision changes. Severe chest pain, dizziness, and seizures. How is this diagnosed? This condition is diagnosed by  measuring your blood pressure while you are seated, with your arm resting on a flat surface, your legs uncrossed, and your feet flat on the floor. The cuff of the blood pressure monitor will be placed directly against the skin of your upper arm at the level of your heart. Blood pressure should be measured at least twice using the same arm. Certain conditions can cause a difference in blood pressure between your right and left arms. If you have a high blood pressure reading during one visit or you have normal blood pressure with other risk factors, you may be asked to: Return on a different day to have your blood pressure checked again. Monitor your blood pressure at home for 1 week or longer. If you are diagnosed with hypertension, you may have other blood or imaging tests to help your health care provider understand your overall risk for other conditions. How is this treated? This condition is treated by making healthy lifestyle changes, such as eating healthy foods, exercising more, and reducing your alcohol intake. You may be referred for counseling on a healthy diet and physical activity. Your health care provider may prescribe medicine if lifestyle changes are not enough to get your blood pressure under control and if: Your systolic blood pressure is above 130. Your diastolic blood pressure is above 80. Your personal target blood pressure may vary depending on your medical conditions, your age, and other factors. Follow these instructions at home: Eating and drinking  Eat a diet that is high in fiber and potassium, and low in sodium, added sugar, and fat. An example of this eating plan is called the DASH diet. DASH stands for Dietary Approaches to Stop Hypertension. To eat this way: Eat   plenty of fresh fruits and vegetables. Try to fill one half of your plate at each meal with fruits and vegetables. Eat whole grains, such as whole-wheat pasta, brown rice, or whole-grain bread. Fill about one  fourth of your plate with whole grains. Eat or drink low-fat dairy products, such as skim milk or low-fat yogurt. Avoid fatty cuts of meat, processed or cured meats, and poultry with skin. Fill about one fourth of your plate with lean proteins, such as fish, chicken without skin, beans, eggs, or tofu. Avoid pre-made and processed foods. These tend to be higher in sodium, added sugar, and fat. Reduce your daily sodium intake. Many people with hypertension should eat less than 1,500 mg of sodium a day. Do not drink alcohol if: Your health care provider tells you not to drink. You are pregnant, may be pregnant, or are planning to become pregnant. If you drink alcohol: Limit how much you have to: 0-1 drink a day for women. 0-2 drinks a day for men. Know how much alcohol is in your drink. In the [place] one drink equals one 12 oz bottle of beer (355 mL), one 5 oz glass of wine (148 mL), or one 1 oz glass of hard liquor (44 mL). Lifestyle  Work with your health care provider to maintain a healthy body weight or to lose weight. Ask what an ideal weight is for you. Get at least 30 minutes of exercise that causes your heart to beat faster (aerobic exercise) most days of the week. Activities may include walking, swimming, or biking. Include exercise to strengthen your muscles (resistance exercise), such as Pilates or lifting weights, as part of your weekly exercise routine. Try to do these types of exercises for 30 minutes at least 3 days a week. Do not use any products that contain nicotine or tobacco. These products include cigarettes, chewing tobacco, and vaping devices, such as e-cigarettes. If you need help quitting, ask your health care provider. Monitor your blood pressure at home as told by your health care provider. Keep all follow-up visits. This is important. Medicines Take over-the-counter and prescription medicines only as told by your health care provider. Follow directions carefully. Blood  pressure medicines must be taken as prescribed. Do not skip doses of blood pressure medicine. Doing this puts you at risk for problems and can make the medicine less effective. Ask your health care provider about side effects or reactions to medicines that you should watch for. Contact a health care provider if you: Think you are having a reaction to a medicine you are taking. Have headaches that keep coming back (recurring). Feel dizzy. Have swelling in your ankles. Have trouble with your vision. Get help right away if you: Develop a severe headache or confusion. Have unusual weakness or numbness. Feel faint. Have severe pain in your chest or abdomen. Vomit repeatedly. Have trouble breathing. These symptoms may be an emergency. Get help right away. Call 911. Do not wait to see if the symptoms will go away. Do not drive yourself to the hospital. Summary Hypertension is when the force of blood pumping through your arteries is too strong. If this condition is not controlled, it may put you at risk for serious complications. Your personal target blood pressure may vary depending on your medical conditions, your age, and other factors. For most people, a normal blood pressure is less than 120/80. Hypertension is treated with lifestyle changes, medicines, or a combination of both. Lifestyle changes include losing weight, eating a healthy,   low-sodium diet, exercising more, and limiting alcohol. This information is not intended to replace advice given to you by your health care provider. Make sure you discuss any questions you have with your health care provider. Document Revised: 06/06/2021 Document Reviewed: 06/06/2021 Elsevier Patient Education  [place].  Influenza (Flu) Vaccine (Inactivated or Recombinant): What You Need to Know 1. Why get vaccinated? Influenza vaccine can prevent influenza (flu). Flu is a contagious disease that spreads around the [place] every year,  usually between October and May. Anyone can get the flu, but it is more dangerous for some people. Infants and young children, people 59 years and older, pregnant people, and people with certain health conditions or a weakened immune system are at greatest risk of flu complications. Pneumonia, bronchitis, sinus infections, and ear infections are examples of flu-related complications. If you have a medical condition, such as heart disease, cancer, or diabetes, flu can make it worse. Flu can cause fever and chills, sore throat, muscle aches, fatigue, cough, headache, and runny or stuffy nose. Some people may have vomiting and diarrhea, though this is more common in children than adults. In an average year, thousands of people in the [place] die from flu, and many more are hospitalized. Flu vaccine prevents millions of illnesses and flu-related visits to the doctor each year. 2. Influenza vaccines CDC recommends everyone 6 months and older get vaccinated every flu season. Children 6 months through 65 years of age may need 2 doses during a single flu season. Everyone else needs only 1 dose each flu season. It takes about 2 weeks for protection to develop after vaccination. There are many flu viruses, and they are always changing. Each year a new flu vaccine is made to protect against the influenza viruses believed to be likely to cause disease in the upcoming flu season. Even when the vaccine doesn't exactly match these viruses, it may still provide some protection. Influenza vaccine does not cause flu. Influenza vaccine may be given at the same time as other vaccines. 3. Talk with your health care provider Tell your vaccination provider if the person getting the vaccine: Has had an allergic reaction after a previous dose of influenza vaccine, or has any severe, life-threatening allergies Has ever had Guillain-Barr Syndrome (also called "GBS") In some cases, your health care provider may decide to  postpone influenza vaccination until a future visit. Influenza vaccine can be administered at any time during pregnancy. People who are or will be pregnant during influenza season should receive inactivated influenza vaccine. People with minor illnesses, such as a cold, may be vaccinated. People who are moderately or severely ill should usually wait until they recover before getting influenza vaccine. Your health care provider can give you more information. 4. Risks of a vaccine reaction Soreness, redness, and swelling where the shot is given, fever, muscle aches, and headache can happen after influenza vaccination. There may be a very small increased risk of Guillain-Barr Syndrome (GBS) after inactivated influenza vaccine (the flu shot). Young children who get the flu shot along with pneumococcal vaccine (PCV13) and/or DTaP vaccine at the same time might be slightly more likely to have a seizure caused by fever. Tell your health care provider if a child who is getting flu vaccine has ever had a seizure. People sometimes faint after medical procedures, including vaccination. Tell your provider if you feel dizzy or have vision changes or ringing in the ears. As with any medicine, there is a very remote chance  of a vaccine causing a severe allergic reaction, other serious injury, or death. 5. What if there is a serious problem? An allergic reaction could occur after the vaccinated person leaves the clinic. If you see signs of a severe allergic reaction (hives, swelling of the face and throat, difficulty breathing, a fast heartbeat, dizziness, or weakness), call 9-1-1 and get the person to the nearest hospital. For other signs that concern you, call your health care provider. Adverse reactions should be reported to the Vaccine Adverse Event Reporting System (VAERS). Your health care provider will usually file this report, or you can do it yourself. Visit the VAERS website at www.vaers.SamedayNews.es or call  (743)345-4668. VAERS is only for reporting reactions, and VAERS staff members do not give medical advice. 6. The National Vaccine Injury Compensation Program The [employer] Vaccine Injury Compensation Program (VICP) is a federal program that was created to compensate people who may have been injured by certain vaccines. Claims regarding alleged injury or death due to vaccination have a time limit for filing, which may be as short as two years. Visit the VICP website at GoldCloset.com.ee or call (705) 015-6657 to learn about the program and about filing a claim. 7. How can I learn more? Ask your health care provider. Call your local or state health department. Visit the website of the Food and Drug Administration (FDA) for vaccine package inserts and additional information at TraderRating.uy. Contact the Centers for Disease Control and Prevention (CDC): Call 212-810-2867 (1-800-CDC-INFO) or Visit CDC's website at https://gibson.com/. Source: CDC Vaccine Information Statement Inactivated Influenza Vaccine (03/18/2020) This same material is available at http://www.wolf.info/ for no charge. This information is not intended to replace advice given to you by your health care provider. Make sure you discuss any questions you have with your health care provider. Document Revised: 06/28/2021 Document Reviewed: 04/20/2021 Elsevier Patient Education  2023 [place] will call to set up your cologuard

## 2022-05-16 NOTE — Progress Notes (Signed)
I,[name],acting as a Education administrator for [employer], FNP.,have documented all relevant documentation on the behalf of [name], FNP,as directed by  [name], FNP while in the presence of [name], [place].  Subjective:     Patient ID: Anthony Fitzpatrick , male    DOB: 1961/05/13 , 61 y.o.   MRN: [id number]   Chief Complaint  Patient presents with   Hypertension   Diabetes    HPI  Patient here for a f/u on his HTN and prediabetes medication follow up. He is currently trying to get his mothers estate straightened  Wt Readings from Last 3 Encounters: 05/16/22 : (!) 421 lb 12.8 oz (191.3 kg) 11/14/21 : (!) 416 lb (188.7 kg) 09/18/21 : (!) 416 lb (188.7 kg)  He is trying to walk more and work on his diet. He is trying to stay away from colas, increasing his vegetable intake.      Past Medical History:  Diagnosis Date   High cholesterol    Hypertension    Kidney stones    Morbid obesity (HCC)    BMI 55   OSA on CPAP      Family History  Problem Relation Age of Onset   Hypertension Father    Kidney disease Father    Hypertension Other    Cancer Sister      Current Outpatient Medications:    aspirin 81 MG chewable tablet, Chew 81 mg by mouth daily., Disp: , Rfl:    metFORMIN (GLUCOPHAGE) 500 MG tablet, Take 1 tablet (500 mg total) by mouth 2 (two) times daily with a meal., Disp: 60 tablet, Rfl: 2   rosuvastatin (CRESTOR) 20 MG tablet, TAKE 1 TABLET BY MOUTH EVERY DAY, Disp: 90 tablet, Rfl: 1   furosemide (LASIX) 20 MG tablet, Take 1 tablet by mouth x 3 days as needed for leg swelling, Disp: 30 tablet, Rfl: 2   valsartan-hydrochlorothiazide (DIOVAN-HCT) 320-12.5 MG tablet, Take 1 tablet by mouth daily., Disp: 90 tablet, Rfl: 1   No Known Allergies   Review of Systems  Constitutional: Negative.   Respiratory: Negative.    Cardiovascular: Negative.   Gastrointestinal: Negative.   Neurological: Negative.      Today's Vitals   05/16/22 0927  BP: (!) 140/60   Pulse: 64  Temp: 97.7 F (36.5 C)  TempSrc: Oral  Weight: (!) 421 lb 12.8 oz (191.3 kg)  Height: _0  (1.727 m)   Body mass index is 64.13 kg/m.   Objective:  Physical Exam Vitals reviewed.  Constitutional:      General: He is not in acute distress.    Appearance: Normal appearance.     Comments: Morbid obesity  HENT:     Head: Normocephalic and atraumatic.  Eyes:     Extraocular Movements: Extraocular movements intact.     Pupils: Pupils are equal, round, and reactive to light.  Cardiovascular:     Rate and Rhythm: Normal rate and regular rhythm.     Pulses: Normal pulses.     Heart sounds: Normal heart sounds. No murmur heard. Pulmonary:     Effort: Pulmonary effort is normal. No respiratory distress.     Breath sounds: Normal breath sounds.  Abdominal:     General: Abdomen is flat. Bowel sounds are normal. There is no distension.     Palpations: Abdomen is soft.  Musculoskeletal:        General: Normal range of motion.  Skin:    General: Skin is warm.  Capillary Refill: Capillary refill takes less than 2 seconds.     Comments: Dark pigmented skin lower extremities  Neurological:     General: No focal deficit present.     Mental Status: He is alert and oriented to person, place, and time.     Cranial Nerves: No cranial nerve deficit.     Motor: No weakness.  Psychiatric:        Mood and Affect: Mood normal.        Behavior: Behavior normal.        Thought Content: Thought content normal.        Judgment: Judgment normal.         Assessment And Plan:     1. Essential hypertension Comments: Blood pressure is slightly elevated - he did not take his medications this morning - BMP8+EGFR - valsartan-hydrochlorothiazide (DIOVAN-HCT) 320-12.5 MG tablet; Take 1 tablet by mouth daily.  Dispense: 90 tablet; Refill: 1  2. Abnormal glucose Comments: Had had a slight increase in his HgbA1c. Will recheck levels today. Tolerating medications well. Continue focusing  on healthy diet and increasing physical activ - BMP8+EGFR - Hemoglobin A1c  3. Mixed hyperlipidemia Comments: Cholesterol levels are stable, will check lipid panel. Continue limiting intake of fried and fatty foods. - Lipid panel  4. Essential hypertension Comments: Blood pressure is well controlled. Continue current medications  - BMP8+EGFR - valsartan-hydrochlorothiazide (DIOVAN-HCT) 320-12.5 MG tablet; Take 1 tablet by mouth daily.  Dispense: 90 tablet; Refill: 1  5. Colon cancer screening According to USPTF Colorectal cancer Screening guidelines. Cologurad is recommended every 3 years, starting at age 65 years. Will refer to GI for colon cancer screening. - Cologuard  6. Morbid obesity ([place]) Comments: Declines referral to dietician. He is encouraged to strive for BMI less than 30 to decrease cardiac risk. Advised to aim for at least 150 minutes of exercise per week.   7. BMI 60.0-69.9, adult ([place])  8. Lower extremity edema Comments: Has not been taking furosemide as needed, refill sent. Continue to limit intake of salt and keep elevated.  - furosemide (LASIX) 20 MG tablet; Take 1 tablet by mouth x 3 days as needed for leg swelling  Dispense: 30 tablet; Refill: 2  9. Need for influenza vaccination Influenza vaccine administered Encouraged to take Tylenol as needed for fever or muscle aches. - Flu Vaccine QUAD 6+ mos PF IM (Fluarix Quad PF)     Patient was given opportunity to ask questions. Patient verbalized understanding of the plan and was able to repeat key elements of the plan. All questions were answered to their satisfaction.  [name], FNP   I, [name], FNP, have reviewed all documentation for this visit. The documentation on 05/16/22 for the exam, diagnosis, procedures, and orders are all accurate and complete.   IF YOU HAVE BEEN REFERRED TO A SPECIALIST, IT MAY TAKE 1-2 WEEKS TO SCHEDULE/PROCESS THE REFERRAL. IF YOU HAVE NOT HEARD FROM US/SPECIALIST IN  TWO WEEKS, PLEASE GIVE [place] A CALL AT [phone] X [phone].   THE PATIENT IS ENCOURAGED TO PRACTICE SOCIAL DISTANCING DUE TO THE COVID-19 PANDEMIC.

## 2022-05-17 LAB — HEMOGLOBIN A1C
Est. average glucose Bld gHb Est-mCnc: 151 mg/dL
Hgb A1c MFr Bld: 6.9 % — ABNORMAL HIGH (ref 4.8–5.6)

## 2022-05-17 LAB — BMP8+EGFR
BUN/Creatinine Ratio: 13 (ref 10–24)
BUN: 13 mg/dL (ref 8–27)
CO2: 22 mmol/L (ref 20–29)
Calcium: 8.7 mg/dL (ref 8.6–10.2)
Chloride: 100 mmol/L (ref 96–106)
Creatinine, Ser: 1 mg/dL (ref 0.76–1.27)
Glucose: 157 mg/dL — ABNORMAL HIGH (ref 70–99)
Potassium: 4.2 mmol/L (ref 3.5–5.2)
Sodium: 138 mmol/L (ref 134–144)
eGFR: 86 mL/min/{1.73_m2} (ref >59)

## 2022-05-24 LAB — LIPID PANEL W/O CHOL/HDL RATIO
Cholesterol, Total: 140 mg/dL (ref 100–199)
HDL: 26 mg/dL — ABNORMAL LOW (ref >39)
LDL Chol Calc (NIH): 82 mg/dL (ref 0–99)
Triglycerides: 185 mg/dL — ABNORMAL HIGH (ref 0–149)
VLDL Cholesterol Cal: 32 mg/dL (ref 5–40)

## 2022-05-24 LAB — SPECIMEN STATUS REPORT

## 2022-06-05 LAB — COLOGUARD: COLOGUARD: NEGATIVE

## 2022-07-13 ENCOUNTER — Other Ambulatory Visit: Payer: Self-pay | Admitting: Nurse Practitioner

## 2022-07-13 DIAGNOSIS — E782 Mixed hyperlipidemia: Secondary | ICD-10-CM

## 2022-08-15 ENCOUNTER — Other Ambulatory Visit: Payer: Self-pay | Admitting: Nurse Practitioner

## 2022-08-15 DIAGNOSIS — R600 Localized edema: Secondary | ICD-10-CM

## 2022-09-13 ENCOUNTER — Other Ambulatory Visit: Payer: Self-pay | Admitting: Nurse Practitioner

## 2022-09-13 DIAGNOSIS — R600 Localized edema: Secondary | ICD-10-CM

## 2022-09-26 ENCOUNTER — Encounter: Payer: 59 | Admitting: Nurse Practitioner

## 2022-11-01 ENCOUNTER — Encounter: Payer: Self-pay | Admitting: Nurse Practitioner

## 2022-11-01 ENCOUNTER — Ambulatory Visit (INDEPENDENT_AMBULATORY_CARE_PROVIDER_SITE_OTHER): Payer: 59 | Admitting: Nurse Practitioner

## 2022-11-01 VITALS — BP 128/66 | HR 75 | Temp 97.6°F | Ht 68.0 in | Wt 398.2 lb

## 2022-11-01 DIAGNOSIS — I10 Essential (primary) hypertension: Secondary | ICD-10-CM | POA: Diagnosis not present

## 2022-11-01 DIAGNOSIS — Z6841 Body Mass Index (BMI) 40.0 and over, adult: Secondary | ICD-10-CM

## 2022-11-01 DIAGNOSIS — R2243 Localized swelling, mass and lump, lower limb, bilateral: Secondary | ICD-10-CM | POA: Diagnosis not present

## 2022-11-01 DIAGNOSIS — R7309 Other abnormal glucose: Secondary | ICD-10-CM | POA: Insufficient documentation

## 2022-11-01 DIAGNOSIS — E782 Mixed hyperlipidemia: Secondary | ICD-10-CM | POA: Diagnosis not present

## 2022-11-01 DIAGNOSIS — Z8616 Personal history of COVID-19: Secondary | ICD-10-CM

## 2022-11-01 NOTE — Patient Instructions (Signed)
Congratulations on your weight loss, keep up the good work.

## 2022-11-01 NOTE — Progress Notes (Signed)
Anthony Fitzpatrick,acting as a Education administrator for Anthony Brine, FNP.,have documented all relevant documentation on the behalf of Anthony Brine, FNP,as directed by  Anthony Brine, FNP while in the presence of Anthony Fitzpatrick, [place].    Subjective:     Patient ID: Anthony Fitzpatrick , male    DOB: [birth year] , 62 y.o.   MRN: [id number]   Chief Complaint  Patient presents with   Hypertension    HPI  Patient presents today for a bp check and chol check. Patient states compliance with medications and has no other concerns today. Patient reports having covid last month. He had scratchy throat and congestion. Food did not have a taste.   BP Readings from Last 3 Encounters: 11/01/22 : (!) 140/78 05/16/22 : (!) 140/60 11/14/21 : 122/84  He has not taken his medications today, generally take at lunch time.   Wt Readings from Last 3 Encounters: 11/01/22 : (!) 398 lb 3.2 oz (180.6 kg) 05/16/22 : (!) 421 lb 12.8 oz (191.3 kg) 11/14/21 : (!) 416 lb (188.7 kg)  He is working on losing weight.    Hypertension This is a chronic problem. The current episode started more than 1 year ago. The problem is unchanged. The problem is controlled. Pertinent negatives include no anxiety, chest pain, headaches or palpitations. There are no associated agents to hypertension. Risk factors for coronary artery disease include obesity and sedentary lifestyle. Past treatments include diuretics and angiotensin blockers. The current treatment provides significant improvement. There are no compliance problems.  There is no history of angina. There is no history of chronic renal disease.     Past Medical History:  Diagnosis Date   High cholesterol    Hypertension    Kidney stones    Morbid obesity (HCC)    BMI 55   OSA on CPAP      Family History  Problem Relation Age of Onset   Hypertension Father    Kidney disease Father    Hypertension Other    Cancer Sister      Current Outpatient Medications:    aspirin 81 MG  chewable tablet, Chew 81 mg by mouth daily., Disp: , Rfl:    furosemide (LASIX) 20 MG tablet, TAKE 1 TABLET BY MOUTH X 3 DAYS AS NEEDED FOR LEG SWELLING, Disp: 270 tablet, Rfl: 1   metFORMIN (GLUCOPHAGE) 500 MG tablet, Take 1 tablet (500 mg total) by mouth 2 (two) times daily with a meal., Disp: 60 tablet, Rfl: 2   rosuvastatin (CRESTOR) 20 MG tablet, TAKE 1 TABLET BY MOUTH EVERY DAY, Disp: 90 tablet, Rfl: 1   valsartan-hydrochlorothiazide (DIOVAN-HCT) 320-12.5 MG tablet, Take 1 tablet by mouth daily., Disp: 90 tablet, Rfl: 1   No Known Allergies   Review of Systems  Cardiovascular:  Negative for chest pain and palpitations.  Neurological:  Negative for headaches.     Today's Vitals   11/01/22 1044 11/01/22 1144  BP: (!) 140/78 128/66  Pulse: 75   Temp: 97.6 F (36.4 C)   TempSrc: Oral   SpO2: 97%   Weight: (!) 398 lb 3.2 oz (180.6 kg)   Height: 5\' 8"  (1.727 m)   PainSc: 0-No pain    Body mass index is 60.55 kg/m.  Wt Readings from Last 3 Encounters:  11/01/22 (!) 398 lb 3.2 oz (180.6 kg)  05/16/22 (!) 421 lb 12.8 oz (191.3 kg)  11/14/21 (!) 416 lb (188.7 kg)    Objective:  Physical Exam Vitals reviewed.  Constitutional:  General: He is not in acute distress.    Appearance: Normal appearance.     Comments: Morbid obesity  Cardiovascular:     Rate and Rhythm: Normal rate and regular rhythm.     Pulses: Normal pulses.     Heart sounds: Normal heart sounds. No murmur heard. Pulmonary:     Effort: Pulmonary effort is normal. No respiratory distress.     Breath sounds: Normal breath sounds. No wheezing.  Musculoskeletal:        General: Normal range of motion.     Right lower leg: Edema (firm) present.     Left lower leg: Edema (firm) present.     Comments: Bilateral lower extremities, firm to touch and has discoloration around his ankles and calves  Skin:    General: Skin is warm and dry.     Capillary Refill: Capillary refill takes less than 2 seconds.      Comments: Dark pigmented skin lower extremities  Neurological:     General: No focal deficit present.     Mental Status: He is alert and oriented to person, place, and time.     Cranial Nerves: No cranial nerve deficit.     Motor: No weakness.  Psychiatric:        Mood and Affect: Mood normal.        Behavior: Behavior normal.        Thought Content: Thought content normal.        Judgment: Judgment normal.         Assessment And Plan:     1. Essential hypertension Comments: Blood pressure is well-controlled.  Continue current medications. - BMP8+eGFR  2. Abnormal glucose Comments: Will check hemoglobin A1c.  Has been taking metformin. - Hemoglobin A1c  3. Mixed hyperlipidemia Comments: Cholesterol levels are stable.  Continue statin.  Tolerating well. - Lipid panel  4. Localized swelling, mass, or lump of lower extremity, bilateral Comments: Continues to have swelling to lower extremities.  Firm, discoloration noted to lower extremities.  Will order venous reflux Doppler to evaluate for circulation. - VAS [place] LOWER EXTREMITY VENOUS REFLUX; Future  5. Morbid obesity (HCC) Chronic Discussed healthy diet and regular exercise options  Encouraged to exercise at least 150 minutes per week with 2 days of strength training  6. BMI 60.0-69.9, adult [facility]) She is encouraged to strive for BMI less than 30 to decrease cardiac risk. Advised to aim for at least 150 minutes of exercise per week.  7. History of COVID-19 Comments: He has had COVID since his last office visit.  Doing better at this time.     Patient was given opportunity to ask questions. Patient verbalized understanding of the plan and was able to repeat key elements of the plan. All questions were answered to their satisfaction.  Anthony Brine, FNP   I, Anthony Brine, FNP, have reviewed all documentation for this visit. The documentation on 11/01/22 for the exam, diagnosis, procedures, and orders are all accurate and  complete.   IF YOU HAVE BEEN REFERRED TO A SPECIALIST, IT MAY TAKE 1-2 WEEKS TO SCHEDULE/PROCESS THE REFERRAL. IF YOU HAVE NOT HEARD FROM US/SPECIALIST IN TWO WEEKS, PLEASE GIVE [place] A CALL AT [phone].   THE PATIENT IS ENCOURAGED TO PRACTICE SOCIAL DISTANCING DUE TO THE COVID-19 PANDEMIC.

## 2022-11-02 LAB — LIPID PANEL
Chol/HDL Ratio: 4.8 ratio (ref 0.0–5.0)
Cholesterol, Total: 120 mg/dL (ref 100–199)
HDL: 25 mg/dL — ABNORMAL LOW (ref >39)
LDL Chol Calc (NIH): 50 mg/dL (ref 0–99)
Triglycerides: 285 mg/dL — ABNORMAL HIGH (ref 0–149)
VLDL Cholesterol Cal: 45 mg/dL — ABNORMAL HIGH (ref 5–40)

## 2022-11-02 LAB — BMP8+EGFR
BUN/Creatinine Ratio: 13 (ref 10–24)
BUN: 15 mg/dL (ref 8–27)
CO2: 24 mmol/L (ref 20–29)
Calcium: 9.7 mg/dL (ref 8.6–10.2)
Chloride: 97 mmol/L (ref 96–106)
Creatinine, Ser: 1.13 mg/dL (ref 0.76–1.27)
Glucose: 409 mg/dL — ABNORMAL HIGH (ref 70–99)
Potassium: 5.1 mmol/L (ref 3.5–5.2)
Sodium: 136 mmol/L (ref 134–144)
eGFR: 74 mL/min/{1.73_m2} (ref >59)

## 2022-11-02 LAB — HEMOGLOBIN A1C
Est. average glucose Bld gHb Est-mCnc: 309 mg/dL
Hgb A1c MFr Bld: 12.4 % — ABNORMAL HIGH (ref 4.8–5.6)

## 2022-11-06 ENCOUNTER — Ambulatory Visit (HOSPITAL_COMMUNITY)
Admission: RE | Admit: 2022-11-06 | Discharge: 2022-11-06 | Disposition: A | Discharge status: Home / Self Care | Payer: 59 | Source: Ambulatory Visit | Attending: Nurse Practitioner | Admitting: Nurse Practitioner

## 2022-11-06 DIAGNOSIS — R2243 Localized swelling, mass and lump, lower limb, bilateral: Secondary | ICD-10-CM | POA: Insufficient documentation

## 2022-12-12 ENCOUNTER — Other Ambulatory Visit: Payer: Self-pay | Admitting: Nurse Practitioner

## 2022-12-12 DIAGNOSIS — I10 Essential (primary) hypertension: Secondary | ICD-10-CM

## 2023-01-13 ENCOUNTER — Other Ambulatory Visit: Payer: Self-pay | Admitting: Nurse Practitioner

## 2023-01-13 DIAGNOSIS — E782 Mixed hyperlipidemia: Secondary | ICD-10-CM

## 2023-02-11 ENCOUNTER — Encounter: Payer: Self-pay | Admitting: Nurse Practitioner

## 2023-02-11 ENCOUNTER — Ambulatory Visit: Payer: 59 | Admitting: Nurse Practitioner

## 2023-02-11 VITALS — BP 110/64 | HR 66 | Temp 97.9°F | Ht 68.0 in | Wt 384.6 lb

## 2023-02-11 DIAGNOSIS — I872 Venous insufficiency (chronic) (peripheral): Secondary | ICD-10-CM

## 2023-02-11 DIAGNOSIS — I10 Essential (primary) hypertension: Secondary | ICD-10-CM

## 2023-02-11 DIAGNOSIS — Z6841 Body Mass Index (BMI) 40.0 and over, adult: Secondary | ICD-10-CM

## 2023-02-11 DIAGNOSIS — E1169 Type 2 diabetes mellitus with other specified complication: Secondary | ICD-10-CM | POA: Diagnosis not present

## 2023-02-11 DIAGNOSIS — E782 Mixed hyperlipidemia: Secondary | ICD-10-CM

## 2023-02-11 DIAGNOSIS — Z79899 Other long term (current) drug therapy: Secondary | ICD-10-CM

## 2023-02-11 MED ORDER — METFORMIN HCL 500 MG PO TABS
500.0000 mg | ORAL_TABLET | Freq: Two times a day (BID) | ORAL | 1 refills | Status: AC
Start: 2023-02-11 — End: 2024-02-11

## 2023-02-11 MED ORDER — SEMAGLUTIDE(0.25 OR 0.5MG/DOS) 2 MG/3ML ~~LOC~~ SOPN: 0.5000 mg | PEN_INJECTOR | SUBCUTANEOUS | 1 refills | Status: DC

## 2023-02-11 MED ORDER — BLOOD GLUCOSE MONITOR KIT: PACK | 0 refills | Status: AC

## 2023-02-11 NOTE — Assessment & Plan Note (Signed)
Cholesterol levels are stable

## 2023-02-11 NOTE — Assessment & Plan Note (Addendum)
HgbA1c was 12.4 at last visit he did not return call to office for further instructions. Will start Ozempic 0.25 mg for 4 weeks then increase to 0.5 mg for 2 weeks the initial pen will last for 6 weeks.

## 2023-02-11 NOTE — Assessment & Plan Note (Signed)
Stable, continue with compression socks

## 2023-02-11 NOTE — Assessment & Plan Note (Signed)
She is encouraged to strive for BMI less than 30 to decrease cardiac risk. Advised to aim for at least 150 minutes of exercise per week. Congratulated on his 14 lb weight loss

## 2023-02-11 NOTE — Assessment & Plan Note (Signed)
Blood pressure is elevated, repeat improved. Continue current medications

## 2023-02-11 NOTE — Progress Notes (Signed)
Madelaine Bhat, CMA,acting as a Neurosurgeon for Arnette Felts, FNP.,have documented all relevant documentation on the behalf of Arnette Felts, FNP,as directed by  Arnette Felts, FNP while in the presence of Arnette Felts, FNP.  Subjective:  Patient ID: Anthony Fitzpatrick , male    DOB: 07-08-1961 , 62 y.o.   MRN: [id number]  Chief Complaint  Patient presents with   Hypertension    HPI  Patient presents today for chol and bp check. Patient reports compliance with medications. Patient denies any chest pain, sob, headaches.   Wt Readings from Last 3 Encounters: 02/11/23 : (!) 384 lb 9.6 oz (174.5 kg) 11/01/22 : (!) 398 lb 3.2 oz (180.6 kg) 05/16/22 : (!) 421 lb 12.8 oz (191.3 kg)  He is walking more and eating better. His HgbA1c was 12.4 and did not respond to the letter or mychart message for further instructions.   Hypertension This is a chronic problem. The current episode started more than 1 year ago. The problem is unchanged. Pertinent negatives include no anxiety, chest pain or palpitations. Risk factors for coronary artery disease include obesity, sedentary lifestyle and dyslipidemia. There is no history of kidney disease. There is no history of chronic renal disease.     Past Medical History:  Diagnosis Date   BMI 60.0-69.9, adult (HCC) 01/05/2016   Chest pain 01/05/2016   High cholesterol    Hypertension    Kidney stones    Morbid obesity (HCC)    BMI 55   OSA on CPAP      Family History  Problem Relation Age of Onset   Hypertension Father    Kidney disease Father    Hypertension Other    Cancer Sister      Current Outpatient Medications:    aspirin 81 MG chewable tablet, Chew 81 mg by mouth daily., Disp: , Rfl:    blood glucose meter kit and supplies KIT, Dispense based on patient and insurance preference. Use up to four times daily as directed., Disp: 1 each, Rfl: 0   furosemide (LASIX) 20 MG tablet, TAKE 1 TABLET BY MOUTH X 3 DAYS AS NEEDED FOR LEG SWELLING, Disp: 270  tablet, Rfl: 1   rosuvastatin (CRESTOR) 20 MG tablet, TAKE 1 TABLET BY MOUTH EVERY DAY, Disp: 90 tablet, Rfl: 1   Semaglutide,0.25 or 0.5MG /DOS, 2 MG/3ML SOPN, Inject 0.5 mg into the skin once a week., Disp: 9 mL, Rfl: 1   valsartan-hydrochlorothiazide (DIOVAN-HCT) 320-12.5 MG tablet, TAKE 1 TABLET BY MOUTH EVERY DAY, Disp: 90 tablet, Rfl: 1   metFORMIN (GLUCOPHAGE) 500 MG tablet, Take 1 tablet (500 mg total) by mouth 2 (two) times daily with a meal., Disp: 180 tablet, Rfl: 1   No Known Allergies   Review of Systems  Constitutional: Negative.   Eyes: Negative.   Cardiovascular:  Positive for leg swelling. Negative for chest pain and palpitations.  Musculoskeletal: Negative.   Skin: Negative.   Psychiatric/Behavioral: Negative.       Today's Vitals   02/11/23 1045 02/11/23 1215  BP: (!) 140/78 110/64  Pulse: 66   Temp: 97.9 F (36.6 C)   Weight: (!) 384 lb 9.6 oz (174.5 kg)   Height: 5\' 8"  (1.727 m)   PainSc: 0-No pain    Body mass index is 58.48 kg/m.  Wt Readings from Last 3 Encounters:  02/11/23 (!) 384 lb 9.6 oz (174.5 kg)  11/01/22 (!) 398 lb 3.2 oz (180.6 kg)  05/16/22 (!) 421 lb 12.8 oz (191.3 kg)  The ASCVD Risk score (Arnett DK, et al., 2019) failed to calculate for the following reasons:   The valid total cholesterol range is 130 to 320 mg/dL  Objective:  Physical Exam Vitals reviewed.  Constitutional:      General: He is not in acute distress.    Appearance: Normal appearance.     Comments: Morbid obesity  Cardiovascular:     Rate and Rhythm: Normal rate and regular rhythm.     Pulses: Normal pulses.     Heart sounds: Normal heart sounds. No murmur heard. Pulmonary:     Effort: Pulmonary effort is normal. No respiratory distress.     Breath sounds: Normal breath sounds. No wheezing.  Musculoskeletal:        General: Normal range of motion.     Right lower leg: Edema (firm) present.     Left lower leg: Edema (firm) present.     Comments: Bilateral  lower extremities, firm to touch and has discoloration around his ankles and calves  Skin:    General: Skin is warm and dry.     Capillary Refill: Capillary refill takes less than 2 seconds.     Comments: Dark pigmented skin lower extremities  Neurological:     General: No focal deficit present.     Mental Status: He is alert and oriented to person, place, and time.     Cranial Nerves: No cranial nerve deficit.     Motor: No weakness.  Psychiatric:        Mood and Affect: Mood normal.        Behavior: Behavior normal.        Thought Content: Thought content normal.        Judgment: Judgment normal.         Assessment And Plan:  Essential hypertension Assessment & Plan: Blood pressure is elevated, repeat improved. Continue current medications   Type 2 diabetes mellitus with morbid obesity (HCC) Assessment & Plan: HgbA1c was 12.4 at last visit he did not return call to office for further instructions. Will start Ozempic 0.25 mg for 4 weeks then increase to 0.5 mg for 2 weeks the initial pen will last for 6 weeks.     Orders: -     Hemoglobin A1c -     CMP14+EGFR -     blood glucose meter kit and supplies; Dispense based on patient and insurance preference. Use up to four times daily as directed.  Dispense: 1 each; Refill: 0 -     Semaglutide(0.25 or 0.5MG /DOS); Inject 0.5 mg into the skin once a week.  Dispense: 9 mL; Refill: 1 -     metFORMIN HCl; Take 1 tablet (500 mg total) by mouth 2 (two) times daily with a meal.  Dispense: 180 tablet; Refill: 1  Mixed hyperlipidemia Assessment & Plan: Cholesterol levels are stable.   Orders: -     Lipid panel  Chronic venous insufficiency Assessment & Plan: Stable, continue with compression socks   Class 3 severe obesity due to excess calories with body mass index (BMI) of 50.0 to 59.9 in adult, unspecified whether serious comorbidity present [facility]) Assessment & Plan: She is encouraged to strive for BMI less than 30 to decrease  cardiac risk. Advised to aim for at least 150 minutes of exercise per week. Congratulated on his 14 lb weight loss   Other long term (current) drug therapy -     CBC    Return for Uncontrolled BP check-3/4 months.  Patient was given  opportunity to ask questions. Patient verbalized understanding of the plan and was able to repeat key elements of the plan. All questions were answered to their satisfaction.    Jeanell Sparrow, FNP, have reviewed all documentation for this visit. The documentation on 02/11/23 for the exam, diagnosis, procedures, and orders are all accurate and complete.   IF YOU HAVE BEEN REFERRED TO A SPECIALIST, IT MAY TAKE 1-2 WEEKS TO SCHEDULE/PROCESS THE REFERRAL. IF YOU HAVE NOT HEARD FROM US/SPECIALIST IN TWO WEEKS, PLEASE GIVE [place] A CALL AT [phone].

## 2023-02-12 LAB — CMP14+EGFR
ALT: 34 IU/L (ref 0–44)
AST: 49 IU/L — ABNORMAL HIGH (ref 0–40)
Albumin: 3.8 g/dL — ABNORMAL LOW (ref 3.9–4.9)
Alkaline Phosphatase: 98 IU/L (ref 44–121)
BUN/Creatinine Ratio: 12 (ref 10–24)
BUN: 13 mg/dL (ref 8–27)
Bilirubin Total: 1.2 mg/dL (ref 0.0–1.2)
CO2: 23 mmol/L (ref 20–29)
Calcium: 9.2 mg/dL (ref 8.6–10.2)
Chloride: 98 mmol/L (ref 96–106)
Creatinine, Ser: 1.05 mg/dL (ref 0.76–1.27)
Globulin, Total: 4 g/dL (ref 1.5–4.5)
Glucose: 220 mg/dL — ABNORMAL HIGH (ref 70–99)
Potassium: 4.4 mmol/L (ref 3.5–5.2)
Sodium: 134 mmol/L (ref 134–144)
Total Protein: 7.8 g/dL (ref 6.0–8.5)
eGFR: 81 mL/min/{1.73_m2} (ref >59)

## 2023-02-12 LAB — CBC
Hematocrit: 45 % (ref 37.5–51.0)
Hemoglobin: 14 g/dL (ref 13.0–17.7)
MCH: 27.8 pg (ref 26.6–33.0)
MCHC: 31.1 g/dL — ABNORMAL LOW (ref 31.5–35.7)
MCV: 90 fL (ref 79–97)
Platelets: 242 10*3/uL (ref 150–450)
RBC: 5.03 x10E6/uL (ref 4.14–5.80)
RDW: 16 % — ABNORMAL HIGH (ref 11.6–15.4)
WBC: 6.4 10*3/uL (ref 3.4–10.8)

## 2023-02-12 LAB — HEMOGLOBIN A1C
Est. average glucose Bld gHb Est-mCnc: 260 mg/dL
Hgb A1c MFr Bld: 10.7 % — ABNORMAL HIGH (ref 4.8–5.6)

## 2023-02-12 LAB — LIPID PANEL
Chol/HDL Ratio: 5 ratio (ref 0.0–5.0)
Cholesterol, Total: 116 mg/dL (ref 100–199)
HDL: 23 mg/dL — ABNORMAL LOW (ref >39)
LDL Chol Calc (NIH): 55 mg/dL (ref 0–99)
Triglycerides: 233 mg/dL — ABNORMAL HIGH (ref 0–149)
VLDL Cholesterol Cal: 38 mg/dL (ref 5–40)

## 2023-03-08 ENCOUNTER — Encounter: Payer: Self-pay | Admitting: Nurse Practitioner

## 2023-04-23 ENCOUNTER — Other Ambulatory Visit: Payer: Self-pay | Admitting: Nurse Practitioner

## 2023-04-23 DIAGNOSIS — E782 Mixed hyperlipidemia: Secondary | ICD-10-CM

## 2023-04-23 DIAGNOSIS — I10 Essential (primary) hypertension: Secondary | ICD-10-CM

## 2023-04-30 ENCOUNTER — Other Ambulatory Visit: Payer: Self-pay | Admitting: Nurse Practitioner

## 2023-04-30 MED ORDER — EMPAGLIFLOZIN 10 MG PO TABS
10.0000 mg | ORAL_TABLET | Freq: Every day | ORAL | 2 refills | Status: AC
Start: 2023-04-30 — End: ?

## 2023-05-14 NOTE — Progress Notes (Signed)
Madelaine Bhat, CMA,acting as a Neurosurgeon for Arnette Felts, FNP.,have documented all relevant documentation on the behalf of Arnette Felts, FNP,as directed by  Arnette Felts, FNP while in the presence of Arnette Felts, FNP.  Subjective:  Patient ID: Anthony Fitzpatrick , male    DOB: [birth year] , 62 y.o.   MRN: [id number]  Chief Complaint  Patient presents with   Hypertension   Diabetes    HPI  Patient presents today for a bp and dm follow up, Patient reports compliance with medication. Patient denies any chest pain, SOB, or headaches. Patient has no concerns today. Patient reports he is unable to urinate today, will send a take home urine cup to bring back. He reports the Ozempic caused him diarrhea, flatulence, reflux (with bad taste in his mouth). This was after his 2nd injection. He tried again with a probiotic and the next day or 1 1/2 days had the same symptoms. Since stopping the Ozempic he is not having those symptoms. He has now started the [employer] and only has been drinking more.   Wt Readings from Last 3 Encounters: 05/15/23 : (!) 387 lb 12.8 oz (175.9 kg) 02/11/23 : (!) 384 lb 9.6 oz (174.5 kg) 11/01/22 : (!) 398 lb 3.2 oz (180.6 kg)       Past Medical History:  Diagnosis Date   BMI 60.0-69.9, adult (HCC) 01/05/2016   Chest pain 01/05/2016   High cholesterol    Hypertension    Kidney stones    Morbid obesity (HCC)    BMI 55   OSA on CPAP      Family History  Problem Relation Age of Onset   Hypertension Father    Kidney disease Father    Hypertension Other    Cancer Sister      Current Outpatient Medications:    aspirin 81 MG chewable tablet, Chew 81 mg by mouth daily., Disp: , Rfl:    blood glucose meter kit and supplies KIT, Dispense based on patient and insurance preference. Use up to four times daily as directed., Disp: 1 each, Rfl: 0   furosemide (LASIX) 20 MG tablet, TAKE 1 TABLET BY MOUTH X 3 DAYS AS NEEDED FOR LEG SWELLING, Disp: 270 tablet, Rfl: 1   metFORMIN  (GLUCOPHAGE) 500 MG tablet, Take 1 tablet (500 mg total) by mouth 2 (two) times daily with a meal., Disp: 180 tablet, Rfl: 1   rosuvastatin (CRESTOR) 20 MG tablet, TAKE 1 TABLET BY MOUTH EVERY DAY, Disp: 90 tablet, Rfl: 1   valsartan-hydrochlorothiazide (DIOVAN-HCT) 320-12.5 MG tablet, TAKE 1 TABLET BY MOUTH EVERY DAY, Disp: 90 tablet, Rfl: 1   empagliflozin (JARDIANCE) 10 MG TABS tablet, Take 1 tablet (10 mg total) by mouth daily before breakfast., Disp: 90 tablet, Rfl: 2   No Known Allergies   Review of Systems  Constitutional: Negative.   Eyes: Negative.   Respiratory: Negative.    Cardiovascular:  Positive for leg swelling. Negative for chest pain and palpitations.  Musculoskeletal: Negative.   Skin: Negative.   Neurological: Negative.   Psychiatric/Behavioral: Negative.       Today's Vitals   05/15/23 0933 05/15/23 1012  BP: 110/80 120/80  Pulse: 74   Temp: 98.5 F (36.9 C)   TempSrc: Oral   Weight: (!) 387 lb 12.8 oz (175.9 kg)   Height: 5\' 8"  (1.727 m)   PainSc: 0-No pain    Body mass index is 58.96 kg/m.  Wt Readings from Last 3 Encounters:  05/15/23 (!) 387 lb 12.8  oz (175.9 kg)  02/11/23 (!) 384 lb 9.6 oz (174.5 kg)  11/01/22 (!) 398 lb 3.2 oz (180.6 kg)      Objective:  Physical Exam Vitals reviewed.  Constitutional:      General: He is not in acute distress.    Appearance: Normal appearance.     Comments: Morbid obesity  Cardiovascular:     Rate and Rhythm: Normal rate and regular rhythm.     Pulses: Normal pulses.     Heart sounds: Normal heart sounds. No murmur heard. Pulmonary:     Effort: Pulmonary effort is normal. No respiratory distress.     Breath sounds: Normal breath sounds. No wheezing.  Musculoskeletal:        General: Normal range of motion.     Right lower leg: Edema (firm) present.     Left lower leg: Edema (firm) present.     Comments: Bilateral lower extremities, firm to touch and has discoloration around his ankles and calves   Skin:    General: Skin is warm and dry.     Capillary Refill: Capillary refill takes less than 2 seconds.     Comments: Dark pigmented skin lower extremities  Neurological:     General: No focal deficit present.     Mental Status: He is alert and oriented to person, place, and time.     Cranial Nerves: No cranial nerve deficit.     Motor: No weakness.  Psychiatric:        Mood and Affect: Mood normal.        Behavior: Behavior normal.        Thought Content: Thought content normal.        Judgment: Judgment normal.         Assessment And Plan:  Type 2 diabetes mellitus with morbid obesity (HCC) Assessment & Plan: He is not able to tolerate Ozempic and has stopped. Will start Jardiance to help protect his kidneys and heart. Advised if has diarrhea, vomiting or not feeling well to hold the medication.  Orders: -     Basic metabolic panel -     Hemoglobin A1c -     Microalbumin / creatinine urine ratio; Future -     Empagliflozin; Take 1 tablet (10 mg total) by mouth daily before breakfast.  Dispense: 90 tablet; Refill: 2  Essential hypertension Assessment & Plan: Blood pressure is elevated, repeat is slightly more elevated, he is advised to focus on lifestyle modifications. Continue current medications.   Orders: -     Basic metabolic panel -     Microalbumin / creatinine urine ratio; Future  Mixed hyperlipidemia Assessment & Plan: Cholesterol levels are slightly improved but still elevated, continue statin and focus on low fat diet.   Orders: -     Lipid panel  Need for influenza vaccination Assessment & Plan: Influenza vaccine administered Encouraged to take Tylenol as needed for fever or muscle aches.   Orders: -     Flu vaccine trivalent PF, 6mos and older(Flulaval,Afluria,Fluarix,Fluzone)    Return for controlled DM check 4 months.  Patient was given opportunity to ask questions. Patient verbalized understanding of the plan and was able to repeat key  elements of the plan. All questions were answered to their satisfaction.    Jeanell Sparrow, FNP, have reviewed all documentation for this visit. The documentation on 05/15/23 for the exam, diagnosis, procedures, and orders are all accurate and complete.   IF YOU HAVE BEEN REFERRED TO A SPECIALIST,  IT MAY TAKE 1-2 WEEKS TO SCHEDULE/PROCESS THE REFERRAL. IF YOU HAVE NOT HEARD FROM US/SPECIALIST IN TWO WEEKS, PLEASE GIVE [place] A CALL AT [phone].

## 2023-05-15 ENCOUNTER — Ambulatory Visit (INDEPENDENT_AMBULATORY_CARE_PROVIDER_SITE_OTHER): Payer: 59 | Admitting: Nurse Practitioner

## 2023-05-15 ENCOUNTER — Encounter: Payer: Self-pay | Admitting: Nurse Practitioner

## 2023-05-15 DIAGNOSIS — I10 Essential (primary) hypertension: Secondary | ICD-10-CM | POA: Diagnosis not present

## 2023-05-15 DIAGNOSIS — E782 Mixed hyperlipidemia: Secondary | ICD-10-CM | POA: Diagnosis not present

## 2023-05-15 DIAGNOSIS — E1169 Type 2 diabetes mellitus with other specified complication: Secondary | ICD-10-CM | POA: Diagnosis not present

## 2023-05-15 DIAGNOSIS — Z23 Encounter for immunization: Secondary | ICD-10-CM | POA: Diagnosis not present

## 2023-05-15 MED ORDER — EMPAGLIFLOZIN 10 MG PO TABS
10.0000 mg | ORAL_TABLET | Freq: Every day | ORAL | 2 refills | Status: AC
Start: 2023-05-15 — End: ?

## 2023-05-15 NOTE — Patient Instructions (Addendum)
Go to this website to get a coupon card for your Jardiance         [email or web address]  When taking the jardiance hold the medication if you are having diarrhea or vomiting or just not feeling well. Stay well hydrated with water.

## 2023-05-16 ENCOUNTER — Other Ambulatory Visit: Payer: 59

## 2023-05-16 LAB — BASIC METABOLIC PANEL
BUN/Creatinine Ratio: 9 — ABNORMAL LOW (ref 10–24)
BUN: 9 mg/dL (ref 8–27)
CO2: 21 mmol/L (ref 20–29)
Calcium: 8.7 mg/dL (ref 8.6–10.2)
Chloride: 104 mmol/L (ref 96–106)
Creatinine, Ser: 1.02 mg/dL (ref 0.76–1.27)
Glucose: 138 mg/dL — ABNORMAL HIGH (ref 70–99)
Potassium: 4.6 mmol/L (ref 3.5–5.2)
Sodium: 142 mmol/L (ref 134–144)
eGFR: 84 mL/min/{1.73_m2} (ref >59)

## 2023-05-16 LAB — LIPID PANEL
Chol/HDL Ratio: 4.9 {ratio} (ref 0.0–5.0)
Cholesterol, Total: 113 mg/dL (ref 100–199)
HDL: 23 mg/dL — ABNORMAL LOW (ref >39)
LDL Chol Calc (NIH): 58 mg/dL (ref 0–99)
Triglycerides: 193 mg/dL — ABNORMAL HIGH (ref 0–149)
VLDL Cholesterol Cal: 32 mg/dL (ref 5–40)

## 2023-05-16 LAB — HEMOGLOBIN A1C
Est. average glucose Bld gHb Est-mCnc: 174 mg/dL
Hgb A1c MFr Bld: 7.7 % — ABNORMAL HIGH (ref 4.8–5.6)

## 2023-05-17 LAB — MICROALBUMIN / CREATININE URINE RATIO
Creatinine, Urine: 44.7 mg/dL
Microalb/Creat Ratio: 17 mg/g{creat} (ref 0–29)
Microalbumin, Urine: 7.7 ug/mL

## 2023-05-30 DIAGNOSIS — Z23 Encounter for immunization: Secondary | ICD-10-CM | POA: Insufficient documentation

## 2023-05-30 NOTE — Assessment & Plan Note (Signed)
Cholesterol levels are slightly improved but still elevated, continue statin and focus on low fat diet.

## 2023-05-30 NOTE — Assessment & Plan Note (Signed)
Influenza vaccine administered Encouraged to take Tylenol as needed for fever or muscle aches.

## 2023-05-30 NOTE — Assessment & Plan Note (Addendum)
Blood pressure is elevated, repeat is slightly more elevated, he is advised to focus on lifestyle modifications. Continue current medications.

## 2023-05-30 NOTE — Assessment & Plan Note (Addendum)
He is not able to tolerate Ozempic and has stopped. Will start Jardiance to help protect his kidneys and heart. Advised if has diarrhea, vomiting or not feeling well to hold the medication.

## 2023-09-18 ENCOUNTER — Ambulatory Visit: Payer: 59 | Admitting: Nurse Practitioner

## 2023-10-10 ENCOUNTER — Ambulatory Visit: Payer: 59 | Admitting: Nurse Practitioner
